# Patient Record
Sex: Male | Born: 1975 | Race: White | Hispanic: No | Marital: Single | Smoking: Current every day smoker
Health system: Southern US, Community
[De-identification: ages and names within clinical notes are randomized; demographics above are authoritative.]

## PROBLEM LIST (undated history)

## (undated) DIAGNOSIS — D751 Secondary polycythemia: Secondary | ICD-10-CM

## (undated) DIAGNOSIS — R569 Unspecified convulsions: Secondary | ICD-10-CM

---

## 2005-11-06 ENCOUNTER — Emergency Department: Payer: Self-pay | Admitting: Emergency Medicine

## 2014-09-03 ENCOUNTER — Emergency Department
Admit: 2014-09-03 | Disposition: A | Discharge status: Home / Self Care | Payer: Self-pay | Admitting: Emergency Medicine

## 2015-03-16 ENCOUNTER — Encounter: Payer: Self-pay | Admitting: *Deleted

## 2015-03-16 ENCOUNTER — Emergency Department
Admission: EM | Admit: 2015-03-16 | Discharge: 2015-03-16 | Disposition: A | Discharge status: Home / Self Care | Payer: Self-pay | Attending: Emergency Medicine | Admitting: Emergency Medicine

## 2015-03-16 DIAGNOSIS — Y998 Other external cause status: Secondary | ICD-10-CM | POA: Insufficient documentation

## 2015-03-16 DIAGNOSIS — G40909 Epilepsy, unspecified, not intractable, without status epilepticus: Secondary | ICD-10-CM | POA: Insufficient documentation

## 2015-03-16 DIAGNOSIS — S61411A Laceration without foreign body of right hand, initial encounter: Secondary | ICD-10-CM | POA: Insufficient documentation

## 2015-03-16 DIAGNOSIS — Y9289 Other specified places as the place of occurrence of the external cause: Secondary | ICD-10-CM | POA: Insufficient documentation

## 2015-03-16 DIAGNOSIS — W25XXXA Contact with sharp glass, initial encounter: Secondary | ICD-10-CM | POA: Insufficient documentation

## 2015-03-16 DIAGNOSIS — Y9389 Activity, other specified: Secondary | ICD-10-CM | POA: Insufficient documentation

## 2015-03-16 DIAGNOSIS — Z720 Tobacco use: Secondary | ICD-10-CM | POA: Insufficient documentation

## 2015-03-16 DIAGNOSIS — D751 Secondary polycythemia: Secondary | ICD-10-CM | POA: Insufficient documentation

## 2015-03-16 HISTORY — DX: Unspecified convulsions: R56.9

## 2015-03-16 HISTORY — DX: Secondary polycythemia: D75.1

## 2015-03-16 MED ORDER — OXYCODONE-ACETAMINOPHEN 7.5-325 MG PO TABS: 1.0000 | ORAL_TABLET | Freq: Four times a day (QID) | ORAL | Status: DC | PRN

## 2015-03-16 MED ORDER — OXYCODONE-ACETAMINOPHEN 5-325 MG PO TABS: 2.0000 | ORAL_TABLET | Freq: Once | ORAL | Status: DC

## 2015-03-16 MED ORDER — LIDOCAINE HCL (PF) 1 % IJ SOLN
INTRAMUSCULAR | Status: AC
  Filled 2015-03-16: qty 5

## 2015-03-16 MED ORDER — OXYCODONE-ACETAMINOPHEN 5-325 MG PO TABS
2.0000 | ORAL_TABLET | Freq: Once | ORAL | Status: AC
  Administered 2015-03-16: 2 via ORAL
  Filled 2015-03-16: qty 2

## 2015-03-16 MED ORDER — BACITRACIN-NEOMYCIN-POLYMYXIN 400-5-5000 EX OINT
TOPICAL_OINTMENT | CUTANEOUS | Status: AC
  Administered 2015-03-16: 1 via TOPICAL
  Filled 2015-03-16: qty 1

## 2015-03-16 MED ORDER — BACITRACIN-NEOMYCIN-POLYMYXIN OINTMENT TUBE
TOPICAL_OINTMENT | Freq: Once | CUTANEOUS | Status: AC
  Administered 2015-03-16: 1 via TOPICAL

## 2015-03-16 NOTE — ED Notes (Signed)
Pt cut his hand on a broken window. Laceration to the right palm, dressing placed in triage.

## 2015-03-16 NOTE — ED Provider Notes (Signed)
[facility] Emergency Department Provider Note  ____________________________________________  Time seen: Approximately 9:16 PM  I have reviewed the triage vital signs and the nursing notes.   HISTORY  Chief Complaint Extremity Laceration    HPI Robert Atkinson is a 39 y.o. male right hand laceration secondary to a glass cut. Patient cut his hand on a broken window. Patient state hemorrhage is controlled with direct pressure. Patient denies any loss sensation or loss of function. Patient is right-hand dominant. Patient rated his pain as a 10 over 10.   Past Medical History  Diagnosis Date  . Seizures (HCC)   . Polycythemia     There are no active problems to display for this patient.   History reviewed. No pertinent past surgical history.  Current Outpatient Rx  Name  Route  Sig  Dispense  Refill  . oxyCODONE-acetaminophen (PERCOCET) 7.5-325 MG tablet   Oral   Take 1 tablet by mouth every 6 (six) hours as needed for severe pain.   12 tablet   0   . oxyCODONE-acetaminophen (PERCOCET/ROXICET) 5-325 MG tablet   Oral   Take 2 tablets by mouth once.   30 tablet   0     Allergies Review of patient's allergies indicates not on file.  No family history on file.  Social History Social History  Substance Use Topics  . Smoking status: Current Every Day Smoker  . Smokeless tobacco: None  . Alcohol Use: Yes    Review of Systems Constitutional: No fever/chills Eyes: No visual changes. ENT: No sore throat. Cardiovascular: Denies chest pain. Respiratory: Denies shortness of breath. Gastrointestinal: No abdominal pain.  No nausea, no vomiting.  No diarrhea.  No constipation. Genitourinary: Negative for dysuria. Musculoskeletal: Negative for back pain. Skin: Negative for rash. Neurological: Negative for headaches, focal weakness or numbness. Psychiatric:Seizure disorder Hematological/Lymphatic:Polycythemia 10-point ROS otherwise  negative.  ____________________________________________   PHYSICAL EXAM:  VITAL SIGNS: ED Triage Vitals  Enc Vitals Group     BP 03/16/15 2029 138/92 mmHg     Pulse Rate 03/16/15 2029 84     Resp 03/16/15 2029 16     Temp 03/16/15 2029 98 F (36.7 C)     Temp Source 03/16/15 2029 Oral     SpO2 03/16/15 2029 97 %     Weight --      Height --      Head Cir --      Peak Flow --      Pain Score 03/16/15 2030 10     Pain Loc --      Pain Edu? --      Excl. in GC? --     Constitutional: Alert and oriented. Well appearing and in no acute distress. Eyes: Conjunctivae are normal. PERRL. EOMI. Head: Atraumatic. Nose: No congestion/rhinnorhea. Mouth/Throat: Mucous membranes are moist.  Oropharynx non-erythematous. Neck: No stridor.  No cervical spine tenderness to palpation. Hematological/Lymphatic/Immunilogical: No cervical lymphadenopathy. Cardiovascular: Normal rate, regular rhythm. Grossly normal heart sounds.  Good peripheral circulation. Respiratory: Normal respiratory effort.  No retractions. Lungs CTAB. Gastrointestinal: Soft and nontender. No distention. No abdominal bruits. No CVA tenderness. Musculoskeletal: No lower extremity tenderness nor edema.  No joint effusions. Neurologic:  Normal speech and language. No gross focal neurologic deficits are appreciated. No gait instability. Skin:  Skin is warm, dry and intact. No rash noted. 2.5 cm laceration palmar aspect the right hand. There is neurovascular intact free nuchal range of motion. Psychiatric: Mood and affect are normal. Speech  and behavior are normal.  ____________________________________________   LABS (all labs ordered are listed, but only abnormal results are displayed)  Labs Reviewed - No data to display ____________________________________________  EKG   ____________________________________________  RADIOLOGY   ____________________________________________   PROCEDURES  Procedure(s) performed:  See procedure note  Critical Care performed: No LACERATION REPAIR Performed by: [name] Authorized by: [name] Consent: Verbal consent obtained. Risks and benefits: risks, benefits and alternatives were discussed Consent given by: patient Patient identity confirmed: provided demographic data Prepped and Draped in normal sterile fashion Wound explored  Laceration Location: Right hand  Laceration Length: 2.5 cm  No Foreign Bodies seen or palpated  Anesthesia: local infiltration  Local anesthetic: lidocaine 1%   Anesthetic total: 4 mL   Irrigation method: syringe Amount of cleaning: standard  Skin closure: 3-0 nylon   Number of sutures: 6   Technique: Interrupted Patient tolerance: Patient tolerated the procedure well with no immediate complications.  ____________________________________________   INITIAL IMPRESSION / ASSESSMENT AND PLAN / ED COURSE  Pertinent labs & imaging results that were available during my care of the patient were reviewed by me and considered in my medical decision making (see chart for details). Right hand laceration. Patient given discharge instructions and advised follow-up in 10 days for suture removal. Patient given a three-day prescription for Percocets. Patient advised return by ER if condition worsens. ____________________________________________   FINAL CLINICAL IMPRESSION(S) / ED DIAGNOSES  Final diagnoses:  Hand laceration, right, initial encounter      Joni ReiningRonald K [name], PA-C 03/16/15 2124  [name], MD 03/16/15 2314

## 2015-03-16 NOTE — Discharge Instructions (Signed)
Laceration Care, Adult  A laceration is a cut that goes through all layers of the skin. The cut also goes into the tissue that is right under the skin. Some cuts heal on their own. Others need to be closed with stitches (sutures), staples, skin adhesive strips, or wound glue. Taking care of your cut lowers your risk of infection and helps your cut to heal better.  HOW TO TAKE CARE OF YOUR CUT  For stitches or staples:  · Keep the wound clean and dry.  · If you were given a bandage (dressing), you should change it at least one time per day or as told by your doctor. You should also change it if it gets wet or dirty.  · Keep the wound completely dry for the first 24 hours or as told by your doctor. After that time, you may take a shower or a bath. However, make sure that the wound is not soaked in water until after the stitches or staples have been removed.  · Clean the wound one time each day or as told by your doctor:    Wash the wound with soap and water.    Rinse the wound with water until all of the soap comes off.    Pat the wound dry with a clean towel. Do not rub the wound.  · After you clean the wound, put a thin layer of antibiotic ointment on it as told by your doctor. This ointment:    Helps to prevent infection.    Keeps the bandage from sticking to the wound.  · Have your stitches or staples removed as told by your doctor.  If your doctor used skin adhesive strips:   · Keep the wound clean and dry.  · If you were given a bandage, you should change it at least one time per day or as told by your doctor. You should also change it if it gets dirty or wet.  · Do not get the skin adhesive strips wet. You can take a shower or a bath, but be careful to keep the wound dry.  · If the wound gets wet, pat it dry with a clean towel. Do not rub the wound.  · Skin adhesive strips fall off on their own. You can trim the strips as the wound heals. Do not remove any strips that are still stuck to the wound. They will  fall off after a while.  If your doctor used wound glue:  · Try to keep your wound dry, but you may briefly wet it in the shower or bath. Do not soak the wound in water, such as by swimming.  · After you take a shower or a bath, gently pat the wound dry with a clean towel. Do not rub the wound.  · Do not do any activities that will make you really sweaty until the skin glue has fallen off on its own.  · Do not apply liquid, cream, or ointment medicine to your wound while the skin glue is still on.  · If you were given a bandage, you should change it at least one time per day or as told by your doctor. You should also change it if it gets dirty or wet.  · If a bandage is placed over the wound, do not let the tape for the bandage touch the skin glue.  · Do not pick at the glue. The skin glue usually stays on for 5-10 days. Then, it   falls off of the skin.  General Instructions   · To help prevent scarring, make sure to cover your wound with sunscreen whenever you are outside after stitches are removed, after adhesive strips are removed, or when wound glue stays in place and the wound is healed. Make sure to wear a sunscreen of at least 30 SPF.  · Take over-the-counter and prescription medicines only as told by your doctor.  · If you were given antibiotic medicine or ointment, take or apply it as told by your doctor. Do not stop using the antibiotic even if your wound is getting better.  · Do not scratch or pick at the wound.  · Keep all follow-up visits as told by your doctor. This is important.  · Check your wound every day for signs of infection. Watch for:    Redness, swelling, or pain.    Fluid, blood, or pus.  · Raise (elevate) the injured area above the level of your heart while you are sitting or lying down, if possible.  GET HELP IF:  · You got a tetanus shot and you have any of these problems at the injection site:    Swelling.    Very bad pain.    Redness.    Bleeding.  · You have a fever.  · A wound that was  closed breaks open.  · You notice a bad smell coming from your wound or your bandage.  · You notice something coming out of the wound, such as wood or glass.  · Medicine does not help your pain.  · You have more redness, swelling, or pain at the site of your wound.  · You have fluid, blood, or pus coming from your wound.  · You notice a change in the color of your skin near your wound.  · You need to change the bandage often because fluid, blood, or pus is coming from the wound.  · You start to have a new rash.  · You start to have numbness around the wound.  GET HELP RIGHT AWAY IF:  · You have very bad swelling around the wound.  · Your pain suddenly gets worse and is very bad.  · You notice painful lumps near the wound or on skin that is anywhere on your body.  · You have a red streak going away from your wound.  · The wound is on your hand or foot and you cannot move a finger or toe like you usually can.  · The wound is on your hand or foot and you notice that your fingers or toes look pale or bluish.     This information is not intended to replace advice given to you by your health care provider. Make sure you discuss any questions you have with your health care provider.     Document Released: 11/07/2007 Document Revised: 10/05/2014 Document Reviewed: 05/17/2014  Elsevier Interactive Patient Education ©2016 [employer]

## 2016-05-12 IMAGING — CR DG FOOT COMPLETE 3+V*L*
1 series · 3 of 3 positions shown · non-contrast
Comparison: None.

CLINICAL DATA: Left foot pain/injury

EXAM:
LEFT FOOT - COMPLETE 3+ VIEW

[Series 1: dxr foot lt comp w/obliques · 0.14mm/px · 3 of 3 slices shown]
[im 1/3]
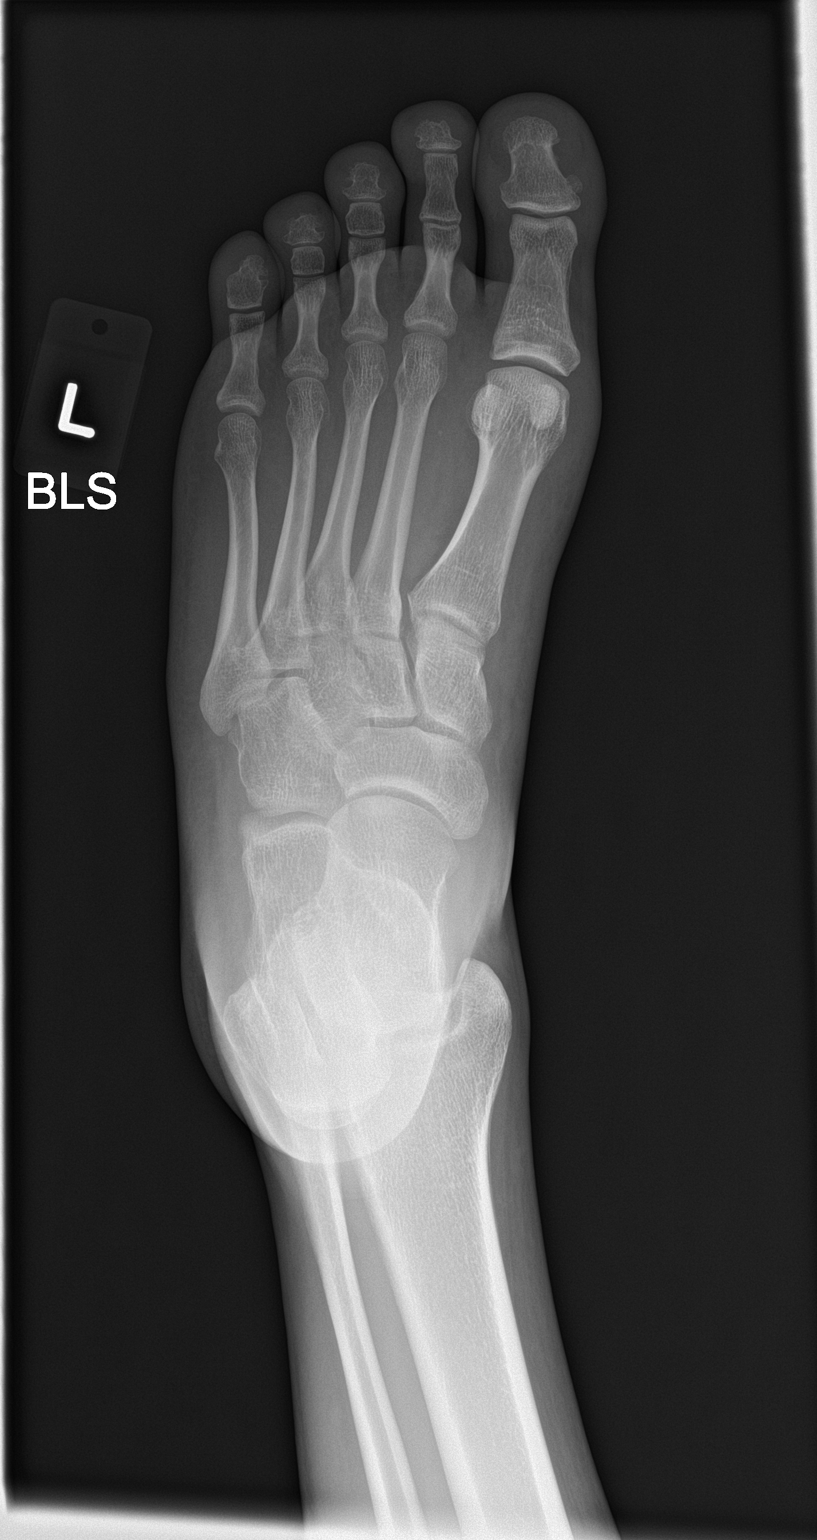
[im 2/3]
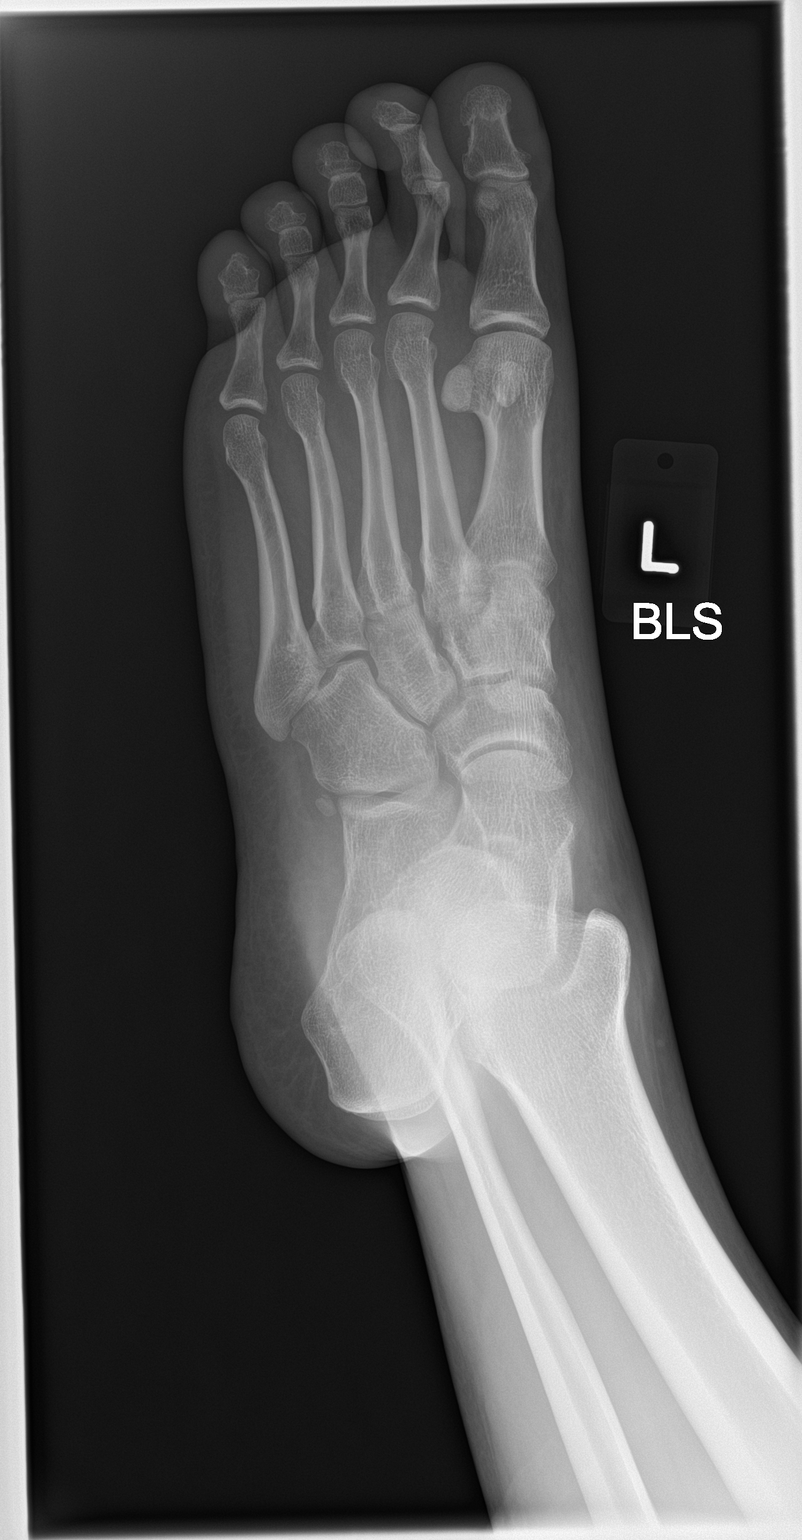
[im 3/3]
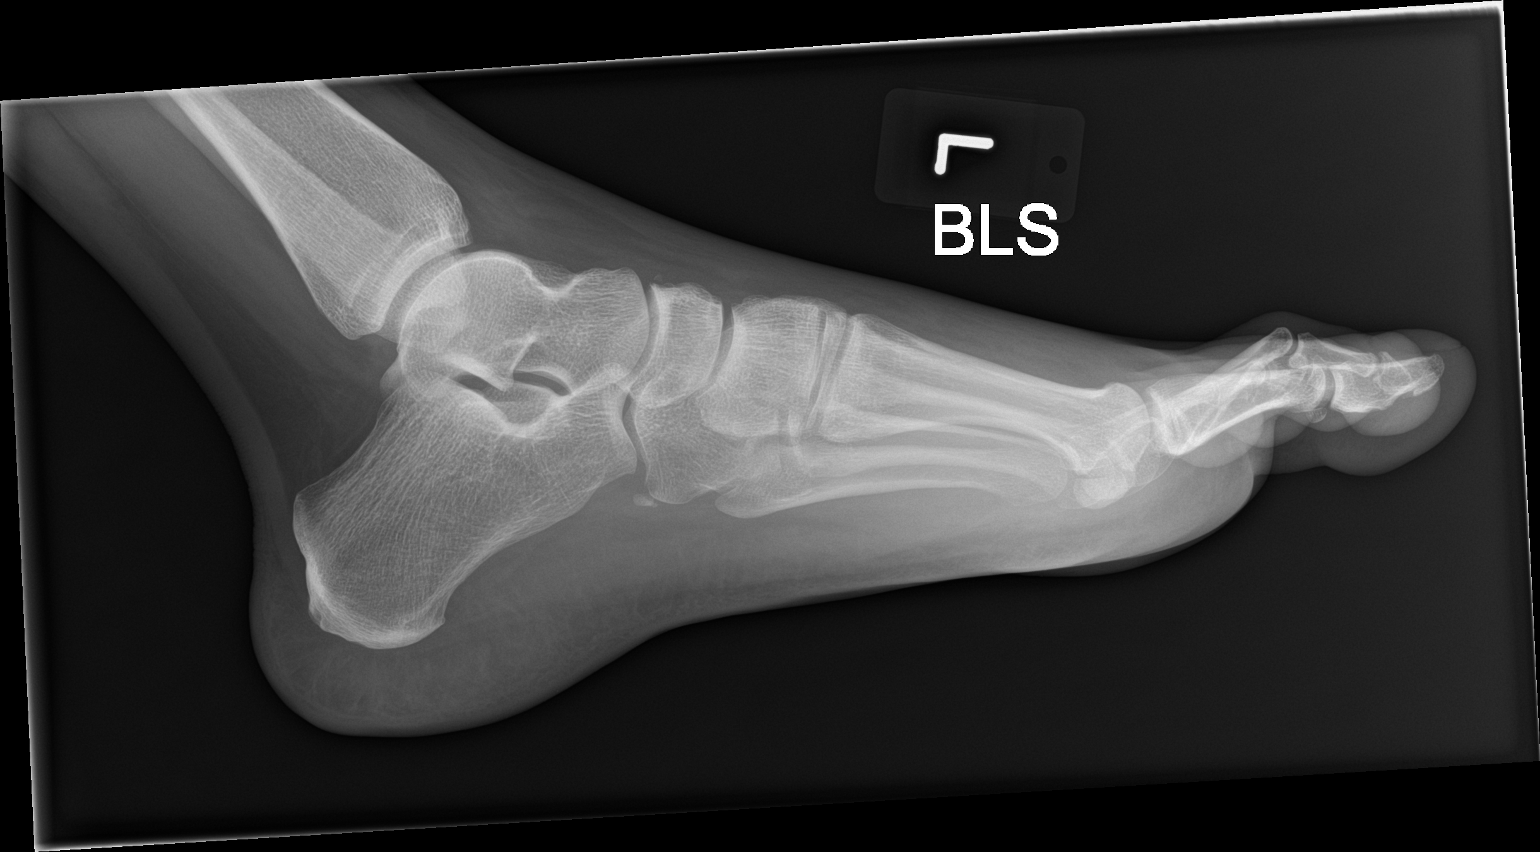

[3 of 3 positions shown; findings below may reference images not displayed]

FINDINGS: No fracture or dislocation is seen.

The joint spaces are preserved.

Visualized soft tissues are within normal limits.
IMPRESSION: No fracture or dislocation is seen.

## 2018-08-14 ENCOUNTER — Emergency Department: Payer: Self-pay

## 2018-08-14 ENCOUNTER — Other Ambulatory Visit: Payer: Self-pay

## 2018-08-14 ENCOUNTER — Emergency Department
Admission: EM | Admit: 2018-08-14 | Discharge: 2018-08-14 | Disposition: A | Discharge status: Home / Self Care | Payer: Self-pay | Attending: Emergency Medicine | Admitting: Emergency Medicine

## 2018-08-14 DIAGNOSIS — S9031XA Contusion of right foot, initial encounter: Secondary | ICD-10-CM | POA: Insufficient documentation

## 2018-08-14 DIAGNOSIS — Y9289 Other specified places as the place of occurrence of the external cause: Secondary | ICD-10-CM | POA: Insufficient documentation

## 2018-08-14 DIAGNOSIS — Y9389 Activity, other specified: Secondary | ICD-10-CM | POA: Insufficient documentation

## 2018-08-14 DIAGNOSIS — Y998 Other external cause status: Secondary | ICD-10-CM | POA: Insufficient documentation

## 2018-08-14 DIAGNOSIS — W228XXA Striking against or struck by other objects, initial encounter: Secondary | ICD-10-CM | POA: Insufficient documentation

## 2018-08-14 MED ORDER — MELOXICAM 7.5 MG PO TABS
15.0000 mg | ORAL_TABLET | Freq: Every day | ORAL | Status: DC
  Administered 2018-08-14: 15 mg via ORAL
  Filled 2018-08-14: qty 2

## 2018-08-14 MED ORDER — MELOXICAM 15 MG PO TABS: 15.0000 mg | ORAL_TABLET | Freq: Every day | ORAL | 1 refills | Status: AC
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
## 2018-08-23 ENCOUNTER — Emergency Department
Admission: EM | Admit: 2018-08-23 | Discharge: 2018-08-23 | Disposition: A | Discharge status: Home / Self Care | Payer: Self-pay | Attending: Emergency Medicine | Admitting: Emergency Medicine

## 2018-08-23 ENCOUNTER — Emergency Department: Payer: Self-pay

## 2018-08-23 ENCOUNTER — Other Ambulatory Visit: Payer: Self-pay

## 2018-08-23 DIAGNOSIS — M79671 Pain in right foot: Secondary | ICD-10-CM | POA: Insufficient documentation

## 2018-08-23 DIAGNOSIS — F17210 Nicotine dependence, cigarettes, uncomplicated: Secondary | ICD-10-CM | POA: Insufficient documentation

## 2018-08-23 MED ORDER — HYDROCODONE-ACETAMINOPHEN 5-325 MG PO TABS
1.0000 | ORAL_TABLET | Freq: Once | ORAL | Status: AC
  Administered 2018-08-23: 1 via ORAL
  Filled 2018-08-23: qty 1

## 2018-08-23 NOTE — ED Provider Notes (Signed)
[facility] Emergency Department Provider Note ____________________________________________  Time seen: Approximately 10:25 PM  I have reviewed the triage vital signs and the nursing notes.   HISTORY  Chief Complaint Foot Pain    HPI Robert Atkinson is a 43 y.o. male who presents to the emergency department for evaluation and treatment of right foot pain.  Patient states that he had a seizure today and somehow hurt his foot.  He was recently evaluated for foot pain after dropping a television on it.  He states that since his seizure today the pain has been worse.  He states that he is not currently on any type of seizure medication because he is "allergic" to all of them.  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 Polycythemia   . Seizures (HCC)     There are no active problems to display for this patient.   History reviewed. No pertinent surgical history.  Prior to Admission medications   Not on File    Allergies Penicillins  No family history on file.  Social History Social History   Tobacco Use  . Smoking status: Current Every Day Smoker    Packs/day: 1.00    Types: Cigarettes  . Smokeless tobacco: Never Used  Substance Use Topics  . Alcohol use: Yes    Alcohol/week: 6.0 standard drinks    Types: 6 Cans of beer per week    Comment: daily  . Drug use: Never    Review of Systems Constitutional: Negative for fever. Cardiovascular: Negative for chest pain. Respiratory: Negative for shortness of breath. Musculoskeletal: Positive for right foot pain Skin: Positive for scabbed abrasion on right foot.  Neurological: Negative for decrease in sensation  ____________________________________________   PHYSICAL EXAM:  VITAL SIGNS: ED Triage Vitals  Enc Vitals Group     BP 08/23/18 2134 (!) 133/93     Pulse Rate 08/23/18 2134 93     Resp 08/23/18  2134 17     Temp 08/23/18 2134 97.9 F (36.6 C)     Temp src --      SpO2 08/23/18 2134 100 %     Weight 08/23/18 2135 149 lb 14.6 oz (68 kg)     Height --      Head Circumference --      Peak Flow --      Pain Score 08/23/18 2134 8     Pain Loc --      Pain Edu? --      Excl. in GC? --     Constitutional: Alert and oriented. Well appearing and in no acute distress. Eyes: Conjunctivae are clear without discharge or drainage Head: Atraumatic Neck: Supple Respiratory: No cough. Respirations are even and unlabored. Musculoskeletal: No deformity of the right ankle or foot on exam.  Full range of motion demonstrated.  No edema noted. Neurologic: Awake, alert, oriented. Skin: Healing abrasion to the dorsal aspect of the right foot without surrounding erythema or lymphangitis.  Psychiatric: Affect and behavior are appropriate.  ____________________________________________   LABS (all labs ordered are listed, but only abnormal results are displayed)  Labs Reviewed - No data to display ____________________________________________  RADIOLOGY  Image of the right foot is negative for acute bony abnormality per radiology. ____________________________________________   PROCEDURES  Procedures  ____________________________________________   INITIAL IMPRESSION / ASSESSMENT AND PLAN / ED COURSE  Robert Atkinson is a 43 y.o.  who presents to the emergency department for treatment and evaluation of right foot pain after having a seizure earlier today.  X-ray of the right foot does not demonstrate any acute bony abnormalities.  The patient will be wrapped in an Ace bandage and advised to rest, ice, and elevate the foot.  He is to continue taking meloxicam as prescribed at his last visit.  I do not see where the patient has had treatment for leukemia and I do not see allergies listed for any antiseizure medications.  The patient was strongly encouraged to obtain a list of his allergies  from his mother and bring the list with him to his next visit.  He has also been advised that he needs to see a neurologist.  He does not wish to have any type of evaluation for the seizure-like activity.  He was advised to follow up with podiatry if the foot pain does not improve with RICE and meloxicam. He is to see schedule an appointment with a primary care provider and neurologist as soon as possible.  Medications  HYDROcodone-acetaminophen (NORCO/VICODIN) 5-325 MG per tablet 1 tablet (has no administration in time range)    Pertinent labs & imaging results that were available during my care of the patient were reviewed by me and considered in my medical decision making (see chart for details).  _________________________________________   FINAL CLINICAL IMPRESSION(S) / ED DIAGNOSES  Final diagnoses:  Foot pain, right    ED Discharge Orders    None       If controlled substance prescribed during this visit, 12 month history viewed on the [place] prior to issuing an initial prescription for Schedule II or III opiod.   Chinita Pester, FNP 08/23/18 2307    Sharyn Creamer, MD 08/24/18 [phone]

## 2018-08-23 NOTE — ED Notes (Signed)
Pt verbalized understanding of d/c instructions, RX, and f/u care. No further questions at this time. Pt ambulatory to the exit.

## 2018-08-23 NOTE — ED Triage Notes (Signed)
Patient c/o right foot pain. Patient reports being seen in this ED for same on 3/12. Patient reports he fell after seizure today and injured same foot. Patient does not want to be evaluated for seizures today - only wants to be evaluated for foot pain.

## 2018-08-23 NOTE — Discharge Instructions (Signed)
Continue taking your meloxicam.  I recommend that you carry a list of your medication allergies to your next visit.  The only allergy listed is penicillin.  I also recommend that you see a neurologist for the seizures.  Return to the emergency department for symptoms of concern if you are unable to schedule an appointment with primary care or the specialist.

## 2020-05-01 IMAGING — DX RIGHT FOOT COMPLETE - 3+ VIEW
3 series · 3 of 3 positions shown · non-contrast
Comparison: 08/14/2018

CLINICAL DATA: Seizure this afternoon, fell twisting his RIGHT
ankle, bruising and swelling, lateral and dorsal pain

EXAM:
RIGHT FOOT COMPLETE - 3+ VIEW

[foot ap]
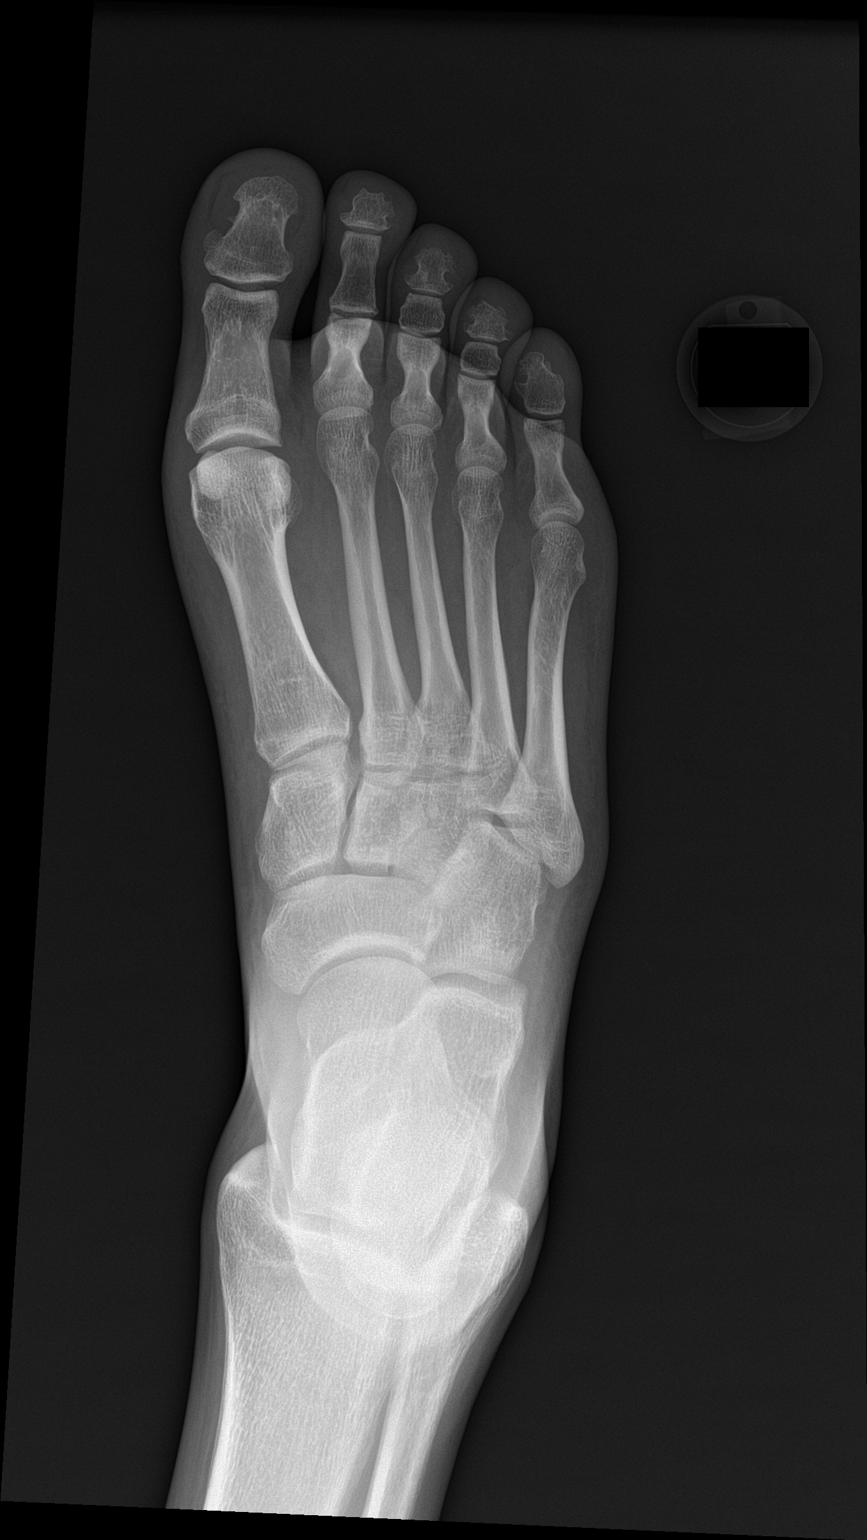

[foot obl]
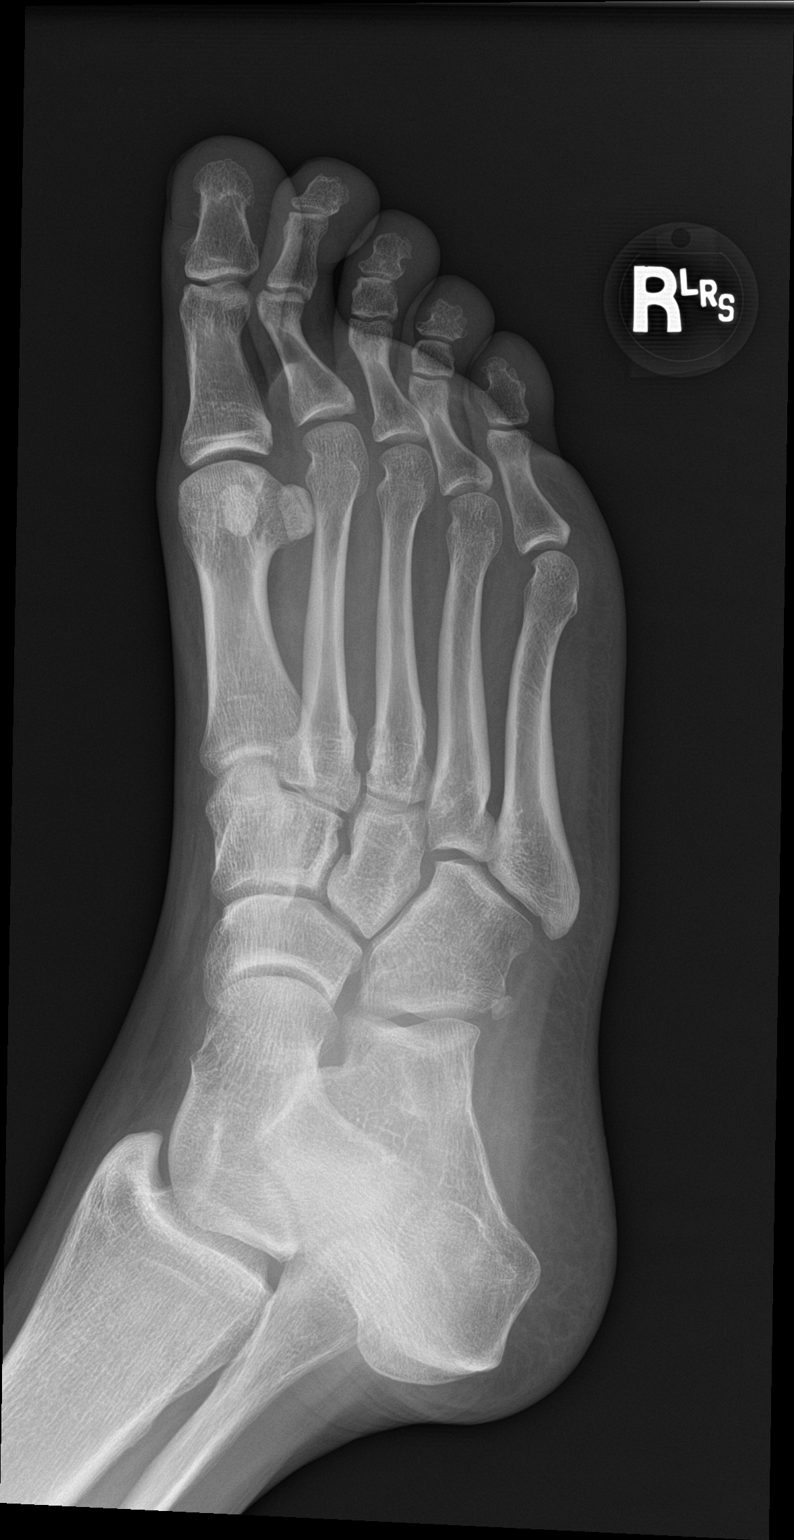

[foot lat]
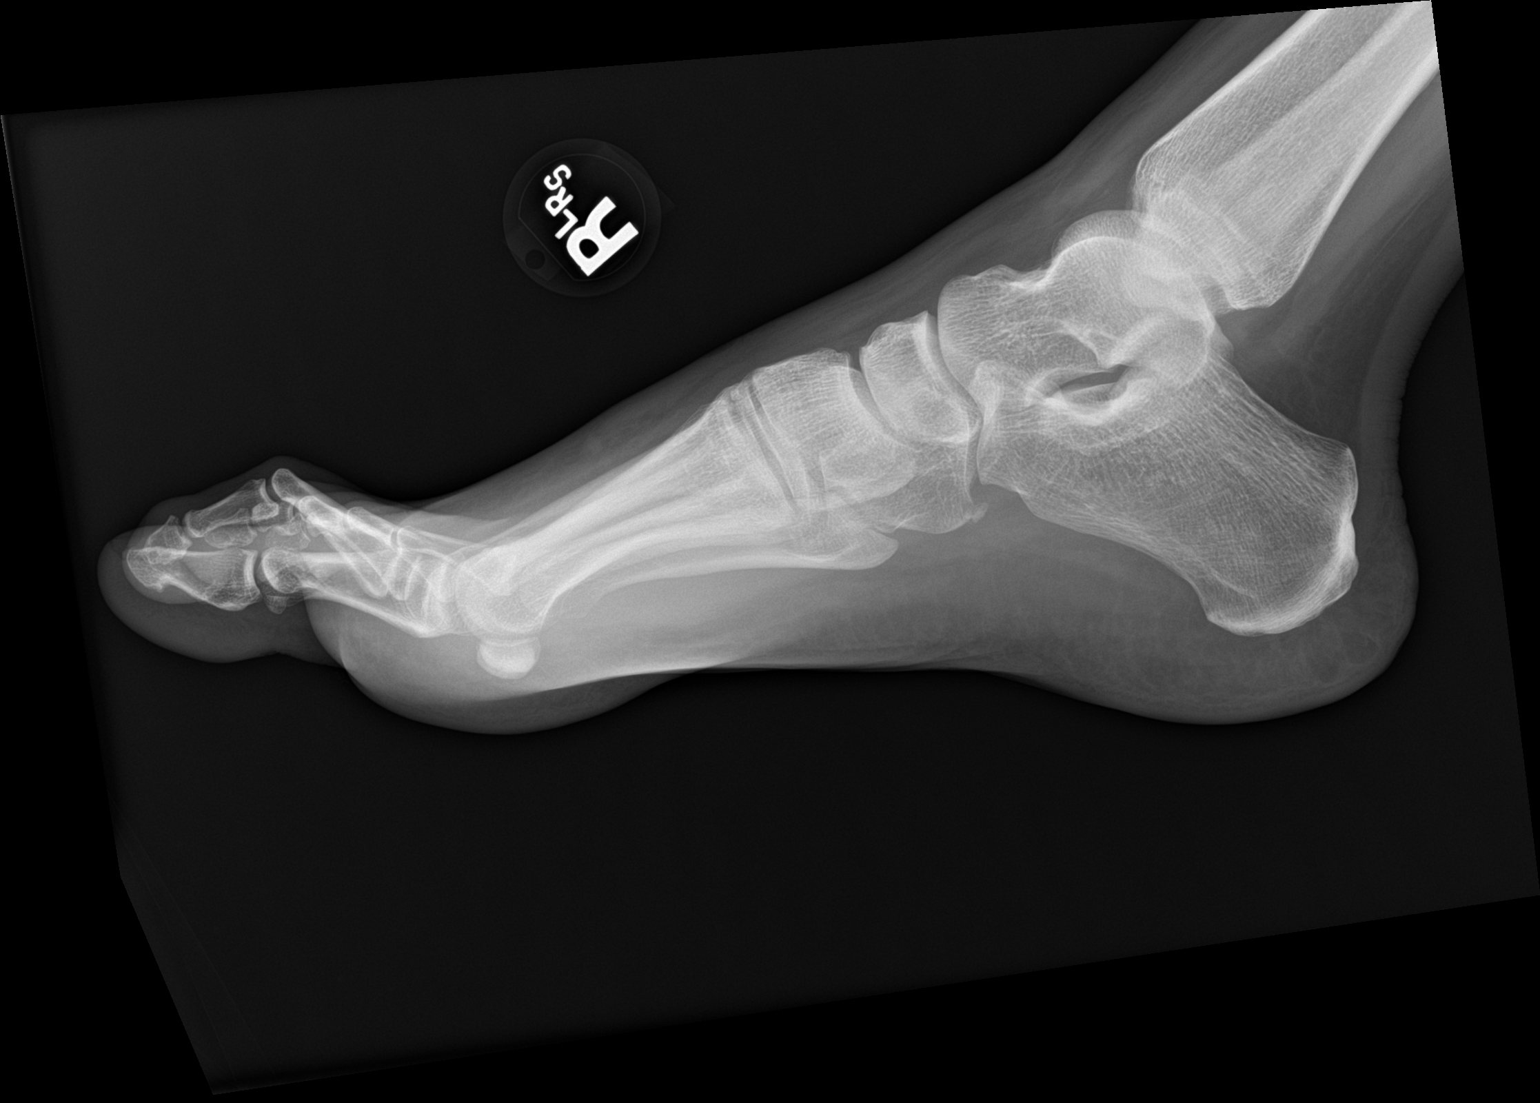

[3 of 3 positions shown; findings below may reference images not displayed]

FINDINGS: Osseous mineralization normal.

Joint spaces preserved.

No acute fracture, dislocation, or bone destruction.
IMPRESSION: No acute osseous abnormalities.

## 2021-05-16 ENCOUNTER — Emergency Department: Payer: Self-pay

## 2021-05-16 ENCOUNTER — Inpatient Hospital Stay
Admission: EM | Admit: 2021-05-16 | Discharge: 2021-06-04 | DRG: 441 | Disposition: E | Payer: Self-pay | Attending: Internal Medicine | Admitting: Internal Medicine

## 2021-05-16 ENCOUNTER — Other Ambulatory Visit: Payer: Self-pay

## 2021-05-16 DIAGNOSIS — Z515 Encounter for palliative care: Secondary | ICD-10-CM

## 2021-05-16 DIAGNOSIS — Z66 Do not resuscitate: Secondary | ICD-10-CM | POA: Diagnosis present

## 2021-05-16 DIAGNOSIS — Z880 Allergy status to penicillin: Secondary | ICD-10-CM

## 2021-05-16 DIAGNOSIS — R188 Other ascites: Secondary | ICD-10-CM | POA: Diagnosis present

## 2021-05-16 DIAGNOSIS — J9601 Acute respiratory failure with hypoxia: Secondary | ICD-10-CM

## 2021-05-16 DIAGNOSIS — K529 Noninfective gastroenteritis and colitis, unspecified: Secondary | ICD-10-CM | POA: Diagnosis present

## 2021-05-16 DIAGNOSIS — F17210 Nicotine dependence, cigarettes, uncomplicated: Secondary | ICD-10-CM | POA: Diagnosis present

## 2021-05-16 DIAGNOSIS — N179 Acute kidney failure, unspecified: Secondary | ICD-10-CM | POA: Diagnosis present

## 2021-05-16 DIAGNOSIS — F1010 Alcohol abuse, uncomplicated: Secondary | ICD-10-CM | POA: Diagnosis present

## 2021-05-16 DIAGNOSIS — N170 Acute kidney failure with tubular necrosis: Secondary | ICD-10-CM

## 2021-05-16 DIAGNOSIS — F10129 Alcohol abuse with intoxication, unspecified: Secondary | ICD-10-CM

## 2021-05-16 DIAGNOSIS — Z681 Body mass index (BMI) 19 or less, adult: Secondary | ICD-10-CM

## 2021-05-16 DIAGNOSIS — J441 Chronic obstructive pulmonary disease with (acute) exacerbation: Secondary | ICD-10-CM | POA: Diagnosis present

## 2021-05-16 DIAGNOSIS — K7201 Acute and subacute hepatic failure with coma: Secondary | ICD-10-CM

## 2021-05-16 DIAGNOSIS — K7290 Hepatic failure, unspecified without coma: Secondary | ICD-10-CM | POA: Diagnosis present

## 2021-05-16 DIAGNOSIS — N19 Unspecified kidney failure: Secondary | ICD-10-CM

## 2021-05-16 DIAGNOSIS — A419 Sepsis, unspecified organism: Secondary | ICD-10-CM

## 2021-05-16 DIAGNOSIS — I781 Nevus, non-neoplastic: Secondary | ICD-10-CM | POA: Diagnosis present

## 2021-05-16 DIAGNOSIS — R64 Cachexia: Secondary | ICD-10-CM | POA: Diagnosis present

## 2021-05-16 DIAGNOSIS — K7200 Acute and subacute hepatic failure without coma: Principal | ICD-10-CM | POA: Diagnosis present

## 2021-05-16 DIAGNOSIS — Z20822 Contact with and (suspected) exposure to covid-19: Secondary | ICD-10-CM | POA: Diagnosis present

## 2021-05-16 DIAGNOSIS — J9602 Acute respiratory failure with hypercapnia: Secondary | ICD-10-CM | POA: Diagnosis present

## 2021-05-16 DIAGNOSIS — D649 Anemia, unspecified: Secondary | ICD-10-CM | POA: Diagnosis present

## 2021-05-16 DIAGNOSIS — G928 Other toxic encephalopathy: Secondary | ICD-10-CM | POA: Diagnosis present

## 2021-05-16 LAB — MAGNESIUM: Magnesium: 2 mg/dL (ref 1.7–2.4)

## 2021-05-16 LAB — BASIC METABOLIC PANEL
Anion gap: 14 (ref 5–15)
BUN: 69 mg/dL — ABNORMAL HIGH (ref 6–20)
CO2: 17 mmol/L — ABNORMAL LOW (ref 22–32)
Calcium: 7.7 mg/dL — ABNORMAL LOW (ref 8.9–10.3)
Chloride: 90 mmol/L — ABNORMAL LOW (ref 98–111)
Creatinine, Ser: 4.97 mg/dL — ABNORMAL HIGH (ref 0.61–1.24)
GFR, Estimated: 14 mL/min — ABNORMAL LOW (ref >60)
Glucose, Bld: 76 mg/dL (ref 70–99)
Potassium: 4.5 mmol/L (ref 3.5–5.1)
Sodium: 121 mmol/L — ABNORMAL LOW (ref 135–145)

## 2021-05-16 LAB — CBC WITH DIFFERENTIAL/PLATELET
Abs Immature Granulocytes: 0.07 10*3/uL (ref 0.00–0.07)
Basophils Absolute: 0 10*3/uL (ref 0.0–0.1)
Basophils Relative: 0 %
Eosinophils Absolute: 0.2 10*3/uL (ref 0.0–0.5)
Eosinophils Relative: 1 %
HCT: 31.6 % — ABNORMAL LOW (ref 39.0–52.0)
Hemoglobin: 11.6 g/dL — ABNORMAL LOW (ref 13.0–17.0)
Immature Granulocytes: 1 %
Lymphocytes Relative: 31 %
Lymphs Abs: 3.6 10*3/uL (ref 0.7–4.0)
MCH: 37.2 pg — ABNORMAL HIGH (ref 26.0–34.0)
MCHC: 36.7 g/dL — ABNORMAL HIGH (ref 30.0–36.0)
MCV: 101.3 fL — ABNORMAL HIGH (ref 80.0–100.0)
Monocytes Absolute: 0.6 10*3/uL (ref 0.1–1.0)
Monocytes Relative: 5 %
Neutro Abs: 7.5 10*3/uL (ref 1.7–7.7)
Neutrophils Relative %: 62 %
Platelets: 135 10*3/uL — ABNORMAL LOW (ref 150–400)
RBC: 3.12 MIL/uL — ABNORMAL LOW (ref 4.22–5.81)
RDW: 16 % — ABNORMAL HIGH (ref 11.5–15.5)
Smear Review: NORMAL
WBC: 11.9 10*3/uL — ABNORMAL HIGH (ref 4.0–10.5)
nRBC: 0.2 % (ref 0.0–0.2)

## 2021-05-16 LAB — BLOOD GAS, VENOUS
Acid-base deficit: 8.1 mmol/L — ABNORMAL HIGH (ref 0.0–2.0)
Bicarbonate: 14.6 mmol/L — ABNORMAL LOW (ref 20.0–28.0)
O2 Saturation: 85.5 %
Patient temperature: 37
pCO2, Ven: 22 mmHg — ABNORMAL LOW (ref 44.0–60.0)
pH, Ven: 7.43 (ref 7.250–7.430)
pO2, Ven: 49 mmHg — ABNORMAL HIGH (ref 32.0–45.0)

## 2021-05-16 LAB — PROTIME-INR
INR: 1.8 — ABNORMAL HIGH (ref 0.8–1.2)
Prothrombin Time: 21.1 seconds — ABNORMAL HIGH (ref 11.4–15.2)

## 2021-05-16 LAB — TROPONIN I (HIGH SENSITIVITY)
Troponin I (High Sensitivity): 6 ng/L (ref <18)
Troponin I (High Sensitivity): 4 ng/L (ref <18)

## 2021-05-16 LAB — LIPASE, BLOOD: Lipase: 83 U/L — ABNORMAL HIGH (ref 11–51)

## 2021-05-16 LAB — LACTIC ACID, PLASMA
Lactic Acid, Venous: 2.6 mmol/L (ref 0.5–1.9)
Lactic Acid, Venous: 3.4 mmol/L (ref 0.5–1.9)
Lactic Acid, Venous: 6.1 mmol/L (ref 0.5–1.9)

## 2021-05-16 LAB — HEPATIC FUNCTION PANEL
ALT: 41 U/L (ref 0–44)
AST: 118 U/L — ABNORMAL HIGH (ref 15–41)
Albumin: 2.3 g/dL — ABNORMAL LOW (ref 3.5–5.0)
Alkaline Phosphatase: 66 U/L (ref 38–126)
Bilirubin, Direct: 3 mg/dL — ABNORMAL HIGH (ref 0.0–0.2)
Indirect Bilirubin: 3.6 mg/dL — ABNORMAL HIGH (ref 0.3–0.9)
Total Bilirubin: 6.6 mg/dL — ABNORMAL HIGH (ref 0.3–1.2)
Total Protein: 6.9 g/dL (ref 6.5–8.1)

## 2021-05-16 LAB — ETHANOL: Alcohol, Ethyl (B): <10 mg/dL (ref <10)

## 2021-05-16 LAB — RESP PANEL BY RT-PCR (FLU A&B, COVID) ARPGX2
Influenza A by PCR: NEGATIVE
Influenza B by PCR: NEGATIVE
SARS Coronavirus 2 by RT PCR: NEGATIVE

## 2021-05-16 LAB — CK: Total CK: 45 U/L — ABNORMAL LOW (ref 49–397)

## 2021-05-16 LAB — PHOSPHORUS: Phosphorus: 4.1 mg/dL (ref 2.5–4.6)

## 2021-05-16 LAB — HIV ANTIBODY (ROUTINE TESTING W REFLEX): HIV Screen 4th Generation wRfx: NONREACTIVE

## 2021-05-16 LAB — AMMONIA: Ammonia: 85 umol/L — ABNORMAL HIGH (ref 9–35)

## 2021-05-16 MED ORDER — ONDANSETRON HCL 4 MG/2ML IJ SOLN
4.0000 mg | Freq: Four times a day (QID) | INTRAMUSCULAR | Status: DC | PRN
  Administered 2021-05-16: 4 mg via INTRAVENOUS
  Filled 2021-05-16: qty 2

## 2021-05-16 MED ORDER — LORAZEPAM 2 MG/ML IJ SOLN: 1.0000 mg | INTRAMUSCULAR | Status: DC | PRN

## 2021-05-16 MED ORDER — VANCOMYCIN HCL IN DEXTROSE 1-5 GM/200ML-% IV SOLN
1000.0000 mg | Freq: Once | INTRAVENOUS | Status: AC
  Administered 2021-05-16: 1000 mg via INTRAVENOUS
  Filled 2021-05-16: qty 200

## 2021-05-16 MED ORDER — HALOPERIDOL LACTATE 5 MG/ML IJ SOLN: 0.5000 mg | INTRAMUSCULAR | Status: DC | PRN

## 2021-05-16 MED ORDER — MORPHINE SULFATE (PF) 2 MG/ML IV SOLN
2.0000 mg | INTRAVENOUS | Status: DC | PRN
  Administered 2021-05-16: 2 mg via INTRAVENOUS
  Filled 2021-05-16: qty 1

## 2021-05-16 MED ORDER — ORAL CARE MOUTH RINSE
15.0000 mL | Freq: Two times a day (BID) | OROMUCOSAL | Status: DC
  Administered 2021-05-17 – 2021-05-18 (×3): 15 mL via OROMUCOSAL

## 2021-05-16 MED ORDER — MORPHINE 100MG IN NS 100ML (1MG/ML) PREMIX INFUSION
2.0000 mg/h | INTRAVENOUS | Status: DC
  Administered 2021-05-16: 3 mg/h via INTRAVENOUS
  Administered 2021-05-17 – 2021-05-18 (×3): 5 mg/h via INTRAVENOUS
  Filled 2021-05-16 (×4): qty 100

## 2021-05-16 MED ORDER — SODIUM CHLORIDE 0.9 % IV SOLN: 2.0000 g | INTRAVENOUS | Status: DC

## 2021-05-16 MED ORDER — METRONIDAZOLE 500 MG/100ML IV SOLN
500.0000 mg | Freq: Once | INTRAVENOUS | Status: AC
  Administered 2021-05-16: 500 mg via INTRAVENOUS
  Filled 2021-05-16: qty 100

## 2021-05-16 MED ORDER — DOCUSATE SODIUM 100 MG PO CAPS: 100.0000 mg | ORAL_CAPSULE | Freq: Two times a day (BID) | ORAL | Status: DC | PRN

## 2021-05-16 MED ORDER — GLYCOPYRROLATE 1 MG PO TABS
1.0000 mg | ORAL_TABLET | ORAL | Status: DC | PRN
  Filled 2021-05-16: qty 1

## 2021-05-16 MED ORDER — IPRATROPIUM-ALBUTEROL 0.5-2.5 (3) MG/3ML IN SOLN: 3.0000 mL | RESPIRATORY_TRACT | Status: DC | PRN

## 2021-05-16 MED ORDER — LACTATED RINGERS IV BOLUS
1000.0000 mL | Freq: Once | INTRAVENOUS | Status: AC
  Administered 2021-05-16: 1000 mL via INTRAVENOUS

## 2021-05-16 MED ORDER — IPRATROPIUM-ALBUTEROL 0.5-2.5 (3) MG/3ML IN SOLN
3.0000 mL | Freq: Once | RESPIRATORY_TRACT | Status: AC
  Administered 2021-05-16: 3 mL via RESPIRATORY_TRACT
  Filled 2021-05-16: qty 3

## 2021-05-16 MED ORDER — GLYCOPYRROLATE 0.2 MG/ML IJ SOLN
0.2000 mg | INTRAMUSCULAR | Status: DC | PRN
  Administered 2021-05-17: 04:00:00 0.2 mg via INTRAVENOUS
  Filled 2021-05-16: qty 1

## 2021-05-16 MED ORDER — GLYCOPYRROLATE 0.2 MG/ML IJ SOLN: 0.2000 mg | INTRAMUSCULAR | Status: DC | PRN

## 2021-05-16 MED ORDER — BIOTENE DRY MOUTH MT LIQD
15.0000 mL | OROMUCOSAL | Status: DC | PRN
  Filled 2021-05-16: qty 15

## 2021-05-16 MED ORDER — NOREPINEPHRINE 4 MG/250ML-% IV SOLN
0.0000 ug/min | INTRAVENOUS | Status: DC
  Administered 2021-05-16: 2 ug/min via INTRAVENOUS
  Filled 2021-05-16: qty 250

## 2021-05-16 MED ORDER — SODIUM CHLORIDE 0.9% FLUSH: 3.0000 mL | INTRAVENOUS | Status: DC | PRN

## 2021-05-16 MED ORDER — LORAZEPAM 1 MG PO TABS: 1.0000 mg | ORAL_TABLET | ORAL | Status: DC | PRN

## 2021-05-16 MED ORDER — SODIUM CHLORIDE 0.9 % IV SOLN
2.0000 g | Freq: Once | INTRAVENOUS | Status: AC
  Administered 2021-05-16: 2 g via INTRAVENOUS
  Filled 2021-05-16: qty 2

## 2021-05-16 MED ORDER — SODIUM CHLORIDE 0.9% FLUSH
3.0000 mL | Freq: Two times a day (BID) | INTRAVENOUS | Status: DC
  Administered 2021-05-16 – 2021-05-18 (×3): 3 mL via INTRAVENOUS

## 2021-05-16 MED ORDER — SODIUM CHLORIDE 0.9 % IV SOLN: 250.0000 mL | INTRAVENOUS | Status: DC | PRN

## 2021-05-16 MED ORDER — SODIUM CHLORIDE 0.9 % IV BOLUS (SEPSIS)
1000.0000 mL | Freq: Once | INTRAVENOUS | Status: AC
  Administered 2021-05-16: 1000 mL via INTRAVENOUS

## 2021-05-16 MED ORDER — IPRATROPIUM-ALBUTEROL 0.5-2.5 (3) MG/3ML IN SOLN
3.0000 mL | RESPIRATORY_TRACT | Status: DC
  Administered 2021-05-16: 3 mL via RESPIRATORY_TRACT
  Filled 2021-05-16 (×2): qty 3

## 2021-05-16 MED ORDER — METHYLPREDNISOLONE SODIUM SUCC 40 MG IJ SOLR
40.0000 mg | Freq: Two times a day (BID) | INTRAMUSCULAR | Status: DC
  Administered 2021-05-16 – 2021-05-17 (×2): 40 mg via INTRAVENOUS
  Filled 2021-05-16 (×2): qty 1

## 2021-05-16 MED ORDER — BUDESONIDE 0.5 MG/2ML IN SUSP
0.5000 mg | Freq: Two times a day (BID) | RESPIRATORY_TRACT | Status: DC
  Administered 2021-05-16: 0.5 mg via RESPIRATORY_TRACT
  Filled 2021-05-16: qty 2

## 2021-05-16 MED ORDER — ACETAMINOPHEN 325 MG PO TABS: 325.0000 mg | ORAL_TABLET | ORAL | Status: DC | PRN

## 2021-05-16 MED ORDER — ONDANSETRON HCL 4 MG/2ML IJ SOLN: 4.0000 mg | Freq: Four times a day (QID) | INTRAMUSCULAR | Status: DC | PRN

## 2021-05-16 MED ORDER — SODIUM CHLORIDE 0.9 % IV BOLUS: 1000.0000 mL | Freq: Once | INTRAVENOUS | Status: DC

## 2021-05-16 MED ORDER — HALOPERIDOL 0.5 MG PO TABS
0.5000 mg | ORAL_TABLET | ORAL | Status: DC | PRN
  Filled 2021-05-16: qty 1

## 2021-05-16 MED ORDER — METHYLPREDNISOLONE SODIUM SUCC 125 MG IJ SOLR
125.0000 mg | Freq: Once | INTRAMUSCULAR | Status: AC
  Administered 2021-05-16: 125 mg via INTRAVENOUS
  Filled 2021-05-16: qty 2

## 2021-05-16 MED ORDER — HALOPERIDOL LACTATE 2 MG/ML PO CONC
0.5000 mg | ORAL | Status: DC | PRN
  Filled 2021-05-16: qty 0.3

## 2021-05-16 MED ORDER — DIPHENHYDRAMINE HCL 50 MG/ML IJ SOLN: 12.5000 mg | INTRAMUSCULAR | Status: DC | PRN

## 2021-05-16 MED ORDER — ONDANSETRON 4 MG PO TBDP
4.0000 mg | ORAL_TABLET | Freq: Four times a day (QID) | ORAL | Status: DC | PRN
  Filled 2021-05-16: qty 1

## 2021-05-16 MED ORDER — LORAZEPAM 2 MG/ML PO CONC: 1.0000 mg | ORAL | Status: DC | PRN

## 2021-05-16 MED ORDER — FAMOTIDINE IN NACL 20-0.9 MG/50ML-% IV SOLN: 20.0000 mg | INTRAVENOUS | Status: DC

## 2021-05-16 MED ORDER — SODIUM CHLORIDE 0.9 % IV BOLUS
1000.0000 mL | Freq: Once | INTRAVENOUS | Status: AC
  Administered 2021-05-16: 1000 mL via INTRAVENOUS

## 2021-05-16 MED ORDER — POLYETHYLENE GLYCOL 3350 17 G PO PACK: 17.0000 g | PACK | Freq: Every day | ORAL | Status: DC | PRN

## 2021-05-16 NOTE — ED Triage Notes (Signed)
Pt from [facility] via [place], pt was being evicted today and found to be in bed for at least 5 days with diarrhea for at least 5 days. Pt noted to have distended abdomen and audible wheezing.

## 2021-05-16 NOTE — ED Notes (Signed)
Four family members arrived at bedside and requesting to speak with physician. Dr. Belia Heman talking with them over phone at this time.

## 2021-05-16 NOTE — ED Notes (Signed)
Webb Silversmith, NP at bedside at this time.

## 2021-05-16 NOTE — H&P (Signed)
NAME:  Robert Atkinson, MRN:  [id number], DOB:  [birth year], LOS: 0 ADMISSION DATE:  05/14/2021  History of Present Illness:  45 y.o. male with only known PMH as below who presents by EMS from a hotel room with shortness of breath and diarrhea over the last several days.    Per EMS, the patient was staying at a motel and was to be evicted today when the hotel staff found him.    The patient reports diarrhea for the last several days and has been but currently primary complaints of shortness of breath.  He also has some abdominal pain and distention.  Diagnosis is acute renal failure acute liver failure and respiratory failure with septic shock  Patient is at very high risk for cardiac arrest and death Patient moaning and  groaning in pain Able to communicate but lethargic Unable to straighten out legs Spider angiomas all over his body   CT ABD Very abnormal appearance of the liver by noncontrast imaging. Difficult to exclude underlying cirrhosis and multifocal hepatic process or lesions.   Large volume of abdominopelvic ascites.   Suspect some degree of small and large bowel scattered wall thickening suggesting enterocolitis. No associated obstruction pattern or ileus. Negative for free air.  ER COURSE GIVEN 4L IVF's Given NEB therapy   Pertinent  Medical History  +ETOH +tobacco abuse  Significant Hospital Events: Including procedures, antibiotic start and stop dates in addition to other pertinent events   12/13 severe multiorgan failure     Micro Data:  RESP VIRAL PANEL NEG  Antimicrobials:   Antibiotics Given (last 72 hours)     Date/Time Action Medication Dose Rate   05/18/2021 1410 New Bag/Given   ceFEPIme (MAXIPIME) 2 g in sodium chloride 0.9 % 100 mL IVPB 2 g 200 mL/hr   05/08/2021 1532 New Bag/Given   vancomycin (VANCOCIN) IVPB 1000 mg/200 mL premix 1,000 mg 200 mL/hr   06/01/2021 1535 New Bag/Given   metroNIDAZOLE (FLAGYL) IVPB 500 mg 500 mg 100 mL/hr             Objective   Blood pressure 96/79, pulse 76, temperature (!) 97.4 F (36.3 C), temperature source Rectal, resp. rate (!) 22, height 6\' 1"  (1.854 m), weight 60 kg, SpO2 97 %.       No intake or output data in the 24 hours ending 05/28/2021 1727 Filed Weights   05/15/2021 1257  Weight: 60 kg      REVIEW OF SYSTEMS  PATIENT IS UNABLE TO PROVIDE COMPLETE REVIEW OF SYSTEMS DUE TO SEVERE CRITICAL ILLNESS    PHYSICAL EXAMINATION:  GENERAL:critically ill appearing, +resp distress EYES: Pupils equal, round, reactive to light.  No scleral icterus.  MOUTH: Moist mucosal membrane. INTUBATED NECK: Supple.  PULMONARY: +rhonchi, +wheezing CARDIOVASCULAR: S1 and S2.  No murmurs  GASTROINTESTINAL: +pain all over +distended. Positive bowel sounds.  MUSCULOSKELETAL: No swelling, clubbing, or edema.  NEUROLOGIC: lethragic  SKIN:contracted LE     Labs/imaging that I havepersonally reviewed  (right click and "Reselect all SmartList Selections" daily)       ASSESSMENT AND PLAN SYNOPSIS  45 yo white male with severe ETOH abuse with acute liver failure with acute renal failure with acute viral enterocolitis and gastroenteritis with severe resp distress Patient has multiorgan failure and high risk for cardiac arrest and death   Severe ACUTE Hypoxic and Hypercapnic Respiratory Failure Start IV steroids Oxygen as needed      SEVERE COPD EXACERBATION -continue IV steroids as prescribed -continue NEB  THERAPY as prescribed -morphine as needed -wean fio2 as needed and tolerated   SEVERE ALCOHOL ABUSE -Therapy with Thiamine and MVI -High risk for intubation  -high risk for aspiration   CARDIAC ICU monitoring   ACUTE KIDNEY INJURY/Renal Failure -continue Foley Catheter-assess need -Avoid nephrotoxic agents -Follow urine output, BMP -Ensure adequate renal perfusion, optimize oxygenation -Renal dose medications  No intake or output data in the 24 hours ending  05/07/2021 1727   NEUROLOGY Acute toxic metabolic encephalopathy    SEPTIC SHOCK SOURCE-ABD gastroenteritis -follow ABG and LA -follow up cultures -emperic ABX -consider stress dose steroids -aggressive IV fluid resuscitation  INFECTIOUS DISEASE -continue antibiotics as prescribed -follow up cultures   GI GI PROPHYLAXIS as indicated  NUTRITIONAL STATUS DIET-->NPO Constipation protocol as indicated   ELECTROLYTES -follow labs as needed -replace as needed -pharmacy consultation and following   ACUTE ANEMIA- TRANSFUSE AS NEEDED CONSIDER TRANSFUSION  IF HGB<7 DVT PRX with TED/SCD's ONLY     Best practice (right click and "Reselect all SmartList Selections" daily)  Diet: NPO GI prophylaxis: H2B Mobility:  bed rest  Code Status:  FULL Disposition:ICU  Labs   CBC: Recent Labs  Lab 05/30/2021 1300  WBC 11.9*  NEUTROABS 7.5  HGB 11.6*  HCT 31.6*  MCV 101.3*  PLT 135*    Basic Metabolic Panel: Recent Labs  Lab 05/04/2021 1300  NA 121*  K 4.5  CL 90*  CO2 17*  GLUCOSE 76  BUN 69*  CREATININE 4.97*  CALCIUM 7.7*   GFR: Estimated Creatinine Clearance: 15.8 mL/min (A) (by C-G formula based on SCr of 4.97 mg/dL (H)). Recent Labs  Lab 05/06/2021 1300 05/18/2021 1308  WBC 11.9*  --   LATICACIDVEN 3.4* 2.6*    Liver Function Tests: Recent Labs  Lab 05/15/2021 1300  AST 118*  ALT 41  ALKPHOS 66  BILITOT 6.6*  PROT 6.9  ALBUMIN 2.3*   Recent Labs  Lab 05/30/2021 1300  LIPASE 83*   Recent Labs  Lab 05/14/2021 1300  AMMONIA 85*    ABG    Component Value Date/Time   HCO3 14.6 (L) 05/26/2021 1257   ACIDBASEDEF 8.1 (H) 05/21/2021 1257   O2SAT 85.5 05/31/2021 1257     Coagulation Profile: Recent Labs  Lab 05/27/2021 1340  INR 1.8*    Cardiac Enzymes: Recent Labs  Lab 05/08/2021 1300  CKTOTAL 45*    HbA1C: No results found for: HGBA1C  CBG: No results for input(s): GLUCAP in the last 168 hours.   Past Medical History:  He,  has  a past medical history of Polycythemia and Seizures (HCC).   Surgical History:  History reviewed. No pertinent surgical history.   Social History:   reports that he has been smoking cigarettes. He has been smoking an average of 1 pack per day. He has never used smokeless tobacco. He reports current alcohol use of about 6.0 standard drinks per week. He reports that he does not use drugs.   Family History:  His family history is not on file.   Allergies Allergies  Allergen Reactions   Penicillins      Home Medications  Prior to Admission medications   Not on File       DVT/GI PRX  assessed I Assessed the need for Labs I Assessed the need for Foley I Assessed the need for Central Venous Line Family Discussion when available I Assessed the need for Mobilization I made an Assessment of medications to be adjusted accordingly Safety Risk assessment completed  CASE DISCUSSED IN MULTIDISCIPLINARY ROUNDS WITH ICU TEAM     Critical Care Time devoted to patient care services described in this note is 75 minutes.   Critical care was necessary to treat /prevent imminent and life-threatening deterioration.   PATIENT WITH VERY POOR PROGNOSIS I ANTICIPATE PROLONGED ICU LOS  Patient is critically ill. Patient with Multiorgan failure and at high risk for cardiac arrest and death.    Lucie Leather, M.D.  [facility]  Medical Director [facility] Medical Director [facility] Cardio-Pulmonary Department

## 2021-05-16 NOTE — ED Notes (Signed)
Respiratory called at this time to notify of VBG in lab

## 2021-05-16 NOTE — Progress Notes (Signed)
Notified provider and bedside nurse of need to order and draw repeat lactic acid #3.  

## 2021-05-16 NOTE — Progress Notes (Signed)
I Spoke to family over the phone with son and niece and sisters of the patient. Patient has  multi organ failure with liver failure, renal failure, restaurant failure, and cardiac failure and at this time his prognosis is grave and patient is a very high risk for cardiac arrest at this time the whole family has consented and agree to a DNR status along with DNI status. They are interested in making him comfortable and we will prescribe  pain meds as needed but continue medical care as deemed fit.

## 2021-05-16 NOTE — Consult Note (Signed)
PHARMACY -  BRIEF ANTIBIOTIC NOTE   Pharmacy has received consult(s) for cefepime and vancomycin from an ED provider. Patient is also ordered metronidazole. The patient's profile has been reviewed for ht/wt/allergies/indication/available labs.    One time order(s) placed for --Cefepime 2 g IV --Vancomycin 1 g IV  Further antibiotics/pharmacy consults should be ordered by admitting physician if indicated.                       Thank you, Tressie Ellis 05/30/2021  2:13 PM

## 2021-05-16 NOTE — ED Provider Notes (Signed)
[facility] Emergency Department Provider Note ____________________________________________   Event Date/Time   First MD Initiated Contact with Patient 05/23/2021 1255     (approximate)  I have reviewed the triage vital signs and the nursing notes.   HISTORY  Chief Complaint Abdominal Pain  Level 5 caveat: History of present illness limited due to disorganized historian  HPI Robert Atkinson is a 45 y.o. male with only known PMH as below who presents by EMS from a hotel room with shortness of breath and diarrhea over the last several days.  Per EMS, the patient was staying at a motel and was to be evicted today when the hotel staff found him.  The patient reports diarrhea for the last several days and has been but currently primary complaints of shortness of breath.  He also has some abdominal pain and distention.  Past Medical History:  Diagnosis Date   Polycythemia    Seizures ([place])     There are no problems to display for this patient.   History reviewed. No pertinent surgical history.  Prior to Admission medications   Not on File    Allergies Penicillins  History reviewed. No pertinent family history.  Social History Social History   Tobacco Use   Smoking status: Every Day    Packs/day: 1.00    Types: Cigarettes   Smokeless tobacco: Never  Substance Use Topics   Alcohol use: Yes    Alcohol/week: 6.0 standard drinks    Types: 6 Cans of beer per week    Comment: daily   Drug use: Never    Review of Systems  Constitutional: No fever. Cardiovascular: Denies chest pain. Respiratory: Positive for shortness of breath. Gastrointestinal: Positive for diarrhea.  Neurological: Negative for headache.   ____________________________________________   PHYSICAL EXAM:  VITAL SIGNS: ED Triage Vitals  Enc Vitals Group     BP 05/26/2021 1308 (!) 96/55     Pulse Rate 05/22/2021 1304 86     Resp 05/04/2021 1304 (!) 24     Temp 05/09/2021 1304  98.1 F (36.7 C)     Temp Source 05/17/2021 1304 Axillary     SpO2 05/22/2021 1255 99 %     Weight 05/09/2021 1257 132 lb 4.4 oz (60 kg)     Height 05/09/2021 1257 6\' 1"  (1.854 m)     Head Circumference --      Peak Flow --      Pain Score 05/10/2021 1256 10     Pain Loc --      Pain Edu? --      Excl. in [place]? --     Constitutional: Alert and oriented.  Ill-appearing. Eyes: Conjunctivae are injected bilaterally.  EOMI.  PERRLA. Head: Atraumatic. Nose: No congestion/rhinnorhea. Mouth/Throat: Mucous membranes are dry. Neck: Normal range of motion.  Cardiovascular: Normal rate, regular rhythm. Grossly normal heart sounds.  Good peripheral circulation. Respiratory: Increased respiratory effort with some accessory muscle use.  Diffuse wheezing and rhonchi bilaterally. Gastrointestinal: Distended abdomen, soft and nontender.  Genitourinary: No flank tenderness. Musculoskeletal: No lower extremity edema.  Extremities warm and well perfused.  Neurologic:  Normal speech and language.  Motor intact in all extremities. Skin:  Skin is warm and dry. No rash noted. Psychiatric: Anxious appearing.  ____________________________________________   LABS (all labs ordered are listed, but only abnormal results are displayed)  Labs Reviewed  BASIC METABOLIC PANEL - Abnormal; Notable for the following components:      Result Value   Sodium  121 (*)    Chloride 90 (*)    CO2 17 (*)    BUN 69 (*)    Creatinine, Ser 4.97 (*)    Calcium 7.7 (*)    GFR, Estimated 14 (*)    All other components within normal limits  HEPATIC FUNCTION PANEL - Abnormal; Notable for the following components:   Albumin 2.3 (*)    AST 118 (*)    Total Bilirubin 6.6 (*)    Bilirubin, Direct 3.0 (*)    Indirect Bilirubin 3.6 (*)    All other components within normal limits  LACTIC ACID, PLASMA - Abnormal; Notable for the following components:   Lactic Acid, Venous 2.6 (*)    All other components within normal limits  LIPASE,  BLOOD - Abnormal; Notable for the following components:   Lipase 83 (*)    All other components within normal limits  CBC WITH DIFFERENTIAL/PLATELET - Abnormal; Notable for the following components:   WBC 11.9 (*)    RBC 3.12 (*)    Hemoglobin 11.6 (*)    HCT 31.6 (*)    MCV 101.3 (*)    MCH 37.2 (*)    MCHC 36.7 (*)    RDW 16.0 (*)    Platelets 135 (*)    All other components within normal limits  BLOOD GAS, VENOUS - Abnormal; Notable for the following components:   pCO2, Ven 22 (*)    pO2, Ven 49.0 (*)    Bicarbonate 14.6 (*)    Acid-base deficit 8.1 (*)    All other components within normal limits  AMMONIA - Abnormal; Notable for the following components:   Ammonia 85 (*)    All other components within normal limits  CK - Abnormal; Notable for the following components:   Total CK 45 (*)    All other components within normal limits  PROTIME-INR - Abnormal; Notable for the following components:   Prothrombin Time 21.1 (*)    INR 1.8 (*)    All other components within normal limits  RESP PANEL BY RT-PCR (FLU A&B, COVID) ARPGX2  CULTURE, BLOOD (ROUTINE X 2)  CULTURE, BLOOD (ROUTINE X 2)  ETHANOL  LACTIC ACID, PLASMA  URINALYSIS, ROUTINE W REFLEX MICROSCOPIC  URINE DRUG SCREEN, QUALITATIVE ([facility] ONLY)  TROPONIN I (HIGH SENSITIVITY)  TROPONIN I (HIGH SENSITIVITY)   ____________________________________________  EKG  ED ECG REPORT I, Arta Silence, the attending physician, personally viewed and interpreted this ECG.  Date: 06/01/2021 EKG Time: 1307 Rate: 84 Rhythm: normal sinus rhythm QRS Axis: normal Intervals: Prolonged QTc ST/T Wave abnormalities: Nonspecific T wave abnormalities Narrative Interpretation: Nonspecific abnormalities with no evidence of acute ischemia  ____________________________________________  RADIOLOGY  Chest x-ray interpreted by me shows no focal consolidation or edema  CT abdomen/pelvis:  IMPRESSION:  Limited exam without IV and  oral contrast.     Very abnormal appearance of the liver by noncontrast imaging.  Difficult to exclude underlying cirrhosis and multifocal hepatic  process or lesions.     Large volume of abdominopelvic ascites.     Suspect some degree of small and large bowel scattered wall  thickening suggesting enterocolitis. No associated obstruction  pattern or ileus. Negative for free air.     Aortic Atherosclerosis (ICD10-I70.0).    ____________________________________________   PROCEDURES  Procedure(s) performed: No  Procedures  Critical Care performed: Yes  CRITICAL CARE Performed by: Arta Silence   Total critical care time: 40 minutes  Critical care time was exclusive of separately billable procedures and  treating other patients.  Critical care was necessary to treat or prevent imminent or life-threatening deterioration.  Critical care was time spent personally by me on the following activities: development of treatment plan with patient and/or surrogate as well as nursing, discussions with consultants, evaluation of patient's response to treatment, examination of patient, obtaining history from patient or surrogate, ordering and performing treatments and interventions, ordering and review of laboratory studies, ordering and review of radiographic studies, pulse oximetry and re-evaluation of patient's condition.  ____________________________________________   INITIAL IMPRESSION / ASSESSMENT AND PLAN / ED COURSE  Pertinent labs & imaging results that were available during my care of the patient were reviewed by me and considered in my medical decision making (see chart for details).   45 year old male with only documented PMH of polycythemia and seizures presents with diarrhea and shortness of breath after hotel staff attempted to evict him.  I reviewed the past medical records in Epic, and his last ED visit was in 2020 for foot pain.  There are no relevant records in Care  Everywhere.  On exam, the patient is chronically ill-appearing, cachectic, with pale skin and a distended abdomen.  He is hypoxic on room air and borderline hypotensive, but afebrile with normal pulse.  Conjunctiva are injected bilaterally.  He has increased work of breathing and lung exam reveals diffuse wheezing.  The abdomen is nontender.  Mucous membranes are dry.  Differential for the patient's presentation is extremely broad.  Shortness of breath is concerning for pneumonia, influenza or other viral etiology, acute bronchitis, or possible new onset COPD, CHF/fluid overload.  Possible causes of the diarrhea include colitis, diverticulitis, gastroenteritis, or noninfectious cause.  I am also concerned for new onset cirrhosis given the distended abdomen.  We will obtain lab work-up including LFTs, coags, ammonia, sepsis work-up, cardiac enzymes, as well as chest x-ray and CT abdomen/pelvis.  I anticipate admission.  ----------------------------------------- 3:51 PM on June 08, 2021 -----------------------------------------  Lab work-up reveals multiorgan dysfunction including AKI, elevated LFTs and INR concerning for liver failure, hyponatremia, and an elevated ammonia.  The lactate is elevated and the patient remains hypotensive.  I have ordered IV antibiotics for presumed sepsis.  On reassessment, the patient's respiratory status has improved significantly.  O2 saturation is in the high 90s on 3 L by nasal cannula.  He still has some wheezing.  CT shows ascites and likely liver cirrhosis with no evidence of obstruction.  I consulted Dr. Belia Heman from the ICU to evaluate the patient given the persistent hypotension.  He recommended giving 2 additional liters of lactated Ringer's and will come to evaluate the patient.  I signed the patient out to the oncoming ED physician Dr. Larinda Buttery.    ____________________________________________   FINAL CLINICAL IMPRESSION(S) / ED DIAGNOSES  Final diagnoses:   None      NEW MEDICATIONS STARTED DURING THIS VISIT:  New Prescriptions   No medications on file     Note:  This document was prepared using Dragon voice recognition software and may include unintentional dictation errors.    Dionne Bucy, MD 2021/06/08 1553

## 2021-05-16 NOTE — ED Notes (Signed)
Patient verbalizes desire for comfort measures.

## 2021-05-16 NOTE — Consult Note (Signed)
Pharmacy Antibiotic Note  Robert Atkinson is a 45 y.o. male admitted on 05/29/2021 with SOB and diarrhea with large volume abominopelvic ascites c/f SBP. Pharmacy has been consulted for cefepime dosing.  Plan: Cefepime 2 gram Q24H given decrease in renal function  Height: 6\' 1"  (185.4 cm) Weight: 60 kg (132 lb 4.4 oz) IBW/kg (Calculated) : 79.9  Temp (24hrs), Avg:97.8 F (36.6 C), Min:97.4 F (36.3 C), Max:98.1 F (36.7 C)  Recent Labs  Lab 05/23/2021 1300 06/02/2021 1308  WBC 11.9*  --   CREATININE 4.97*  --   LATICACIDVEN 3.4* 2.6*    Estimated Creatinine Clearance: 15.8 mL/min (A) (by C-G formula based on SCr of 4.97 mg/dL (H)).    Allergies  Allergen Reactions   Penicillins     Antimicrobials this admission: 12/ 13 >> Vanco x 1 12/13 > Metronidazole x1 12/13 cefepime >>   Dose adjustments this admission:   Microbiology results: 12/13 BCx: sent   Thank you for allowing pharmacy to be a part of this patients care.  1/14, PharmD, BCPS Clinical Pharmacist   05/04/2021 5:27 PM

## 2021-05-16 NOTE — Progress Notes (Signed)
CODE SEPSIS - PHARMACY COMMUNICATION  **Broad Spectrum Antibiotics should be administered within 1 hour of Sepsis diagnosis**  Time Code Sepsis Called/Page Received: 1357  Antibiotics Ordered: Cefepime + vancomycin + metronidazole  Time of 1st antibiotic administration: 1410  Additional action taken by pharmacy: N/A  Robert Atkinson 2021/06/09  2:16 PM

## 2021-05-16 NOTE — Consult Note (Addendum)
PHARMACY CONSULT NOTE - FOLLOW UP  Pharmacy Consult for Electrolyte Monitoring and Replacement   Recent Labs: Potassium (mmol/L)  Date Value  06/15/21 4.5   Magnesium (mg/dL)  Date Value  15/18/3437 2.0   Calcium (mg/dL)  Date Value  35/78/9784 7.7 (L)   Albumin (g/dL)  Date Value  78/41/2820 2.3 (L)   Phosphorus (mg/dL)  Date Value  81/38/8719 4.1   Sodium (mmol/L)  Date Value  06-15-2021 121 (L)   Corrected Ca: 9.1  Assessment: 45 y.o. male presented on 15-Jun-2021 by EMS from a hotel room with shortness of breath and diarrhea. Found to have acute renal, liver, respiratory failure with septic shock. Pharmacy has been consulted to monitor and replace electrolytes as needed  Goal of Therapy:  Electrolytes WNL  Plan:  Hyponatremia 2/2 GI loss vs volume overload? F/u correction per MD 2L LR and 1L NS boluses given in ED Recheck electrolytes with AM labs   Sharen Hones ,PharmD, BCPS Clinical Pharmacist 15-Jun-2021 7:21 PM

## 2021-05-16 NOTE — Progress Notes (Signed)
Notified bedside nurse of need to draw repeat lactic acid. 

## 2021-05-16 NOTE — Progress Notes (Signed)
Notified bedside nurse of need to draw repeat lactic acid (#3). 

## 2021-05-16 NOTE — ED Notes (Signed)
Lactic of 2.6 passed on to MD Siadecki at this time.

## 2021-05-16 NOTE — Progress Notes (Signed)
Elink monitoring code sepsis 

## 2021-05-17 DIAGNOSIS — Z7189 Other specified counseling: Secondary | ICD-10-CM

## 2021-05-17 DIAGNOSIS — K7201 Acute and subacute hepatic failure with coma: Secondary | ICD-10-CM

## 2021-05-17 NOTE — Progress Notes (Signed)
GOALS OF CARE     Advance Care Planning/Goals of Care discussion was performed during the course of treatment to decide on type of care right for this patient following admission to the ICU.   I met with patient's family ( sister, ex-wife and his son) to discuss goals of care in details following review of patient's lab and diagnostic data, ED Physician's report and the consulting  notes. I reviewed/imaging with the family at the bedside and answered all their question.  Discussed prognosis, expected outcome with or without ongoing aggressive treatments and the options for de-escalation of care.   Diagnosis(es): acute liver failure with acute renal failure with acute viral enterocolitis and gastroenteritis with severe resp distress Prognosis: Poor Code Status: DNR Disposition: ICU Next Steps:Will change status from ICU to medsurg. Family understands the situation. They have consented and agreed to DNR/DNI and would not wish to pursue any aggressive treatment.  Family has consented to proceed with transition to full comfort care measures.  Family are satisfied with Plan of action and management. All questions answered     Total Time Spent Face to Face addressing advance care planning in the presence of the Patient: 25 minutes minutes       Rufina Falco, DNP, FNP-C, AGACNP-BC Acute Care Nurse Practitioner [facility] Pager: [phone] [place] [facility]

## 2021-05-17 NOTE — Progress Notes (Signed)
GOALS OF CARE DISCUSSION  The Clinical status was relayed to family in detail. All family at bedside Updated and notified of patients medical condition.    Patient remains unresponsive and will not open eyes to command.   Patient is having a weak cough and struggling to remove secretions.   Patient with increased WOB and using accessory muscles to breathe Explained to family course of therapy and the modalities    Patient with Progressive multiorgan failure with a very high probablity of a very minimal chance of meaningful recovery despite all aggressive and optimal medical therapy.    PATIENT REMAINS COMFORT CARE MEASURES ON MORPHINE INFUSION Seems to be very comfortable, not in any pain or suffering.  Family understands the situation.   Family are satisfied with Plan of action and management. All questions answered    Lucie Leather, M.D.  [facility]  Medical Director [facility] Medical Director [facility] Cardio-Pulmonary Department

## 2021-05-17 NOTE — TOC Initial Note (Signed)
Transition of Care [facility]) - Initial/Assessment Note    Patient Details  Name: Robert Atkinson MRN: [id number] Date of Birth: 05-Mar-1976  Transition of Care [facility]) CM/SW Contact:    Hetty Ely, RN Phone Number: 05/17/2021, 3:11 PM  Clinical Narrative:   Transition of Care [facility]) Screening Note   Patient Details  Name: Robert Atkinson Date of Birth: 1975/09/02   Transition of Care [facility]) CM/SW Contact:    Hetty Ely, RN Phone Number: 05/17/2021, 3:11 PM    Transition of Care Department [facility]) has reviewed patient and no TOC needs have been identified at this time. We will continue to monitor patient advancement through interdisciplinary progression rounds. If new patient transition needs arise, please place a TOC consult.                         Patient Goals and CMS Choice        Expected Discharge Plan and Services                                                Prior Living Arrangements/Services                       Activities of Daily Living Home Assistive Devices/Equipment: Dan Humphreys (specify type) ADL Screening (condition at time of admission) Patient's cognitive ability adequate to safely complete daily activities?: Yes Is the patient deaf or have difficulty hearing?: No Does the patient have difficulty seeing, even when wearing glasses/contacts?: No Does the patient have difficulty concentrating, remembering, or making decisions?: No Patient able to express need for assistance with ADLs?: Yes Does the patient have difficulty dressing or bathing?: Yes Independently performs ADLs?: No Communication: Independent Dressing (OT): Dependent Is this a change from baseline?: Change from baseline, expected to last >3 days Grooming: Dependent Is this a change from baseline?: Change from baseline, expected to last >3 days Feeding: Dependent Is this a change from baseline?: Change from baseline, expected to last >3 days Bathing:  Dependent Is this a change from baseline?: Change from baseline, expected to last >3 days Toileting: Dependent Is this a change from baseline?: Change from baseline, expected to last >3days In/Out Bed: Dependent Is this a change from baseline?: Change from baseline, expected to last >3 days Walks in Home: Dependent Is this a change from baseline?: Change from baseline, expected to last >3 days Does the patient have difficulty walking or climbing stairs?: Yes Weakness of Legs: Both Weakness of Arms/Hands: Both  Permission Sought/Granted                  Emotional Assessment              Admission diagnosis:  Liver failure (HCC) [K72.90] Patient Active Problem List   Diagnosis Date Noted   Liver failure (HCC) 05/29/2021   PCP:  Patient, [name] Pharmacy:  No Pharmacies Listed    Social Determinants of Health (SDOH) Interventions    Readmission Risk Interventions No flowsheet data found.

## 2021-05-17 NOTE — Consult Note (Signed)
Consultation Note Date: 05/17/2021   Patient Name: Robert Atkinson  DOB: 1976-01-12  MRN: [id number]  Age / Sex: 45 y.o., male  PCP: Patient, [name] Referring Physician: Erin Fulling, MD  Reason for Consultation: Establishing goals of care  HPI/Patient Profile: SIRUS LABRIE is a 45 y.o. male with only known PMH as below who presents by EMS from a hotel room with shortness of breath and diarrhea over the last several days.  Per EMS, the patient was staying at a motel and was to be evicted today when the hotel staff found him.  The patient reports diarrhea for the last several days and has been but currently primary complaints of shortness of breath.  He also has some abdominal pain and distention.  Clinical Assessment and Goals of Care: Patient is resting in bed. Family members including patient's mother is at bedside. Patient is currently on comfort care with orders already in place. He appears comfortable with eyes closed. Even and unlabored respirations.  Patient and mother have been living together in a motel. Per mother patient drinks around 6 beers per day. Other family members state patient drinks large amounts of ETOH, and they all agree he smokes marijuana daily. Per the family he has not wanted to seek medical care for any issues until prior to this admission. They state he has had bruising and rapid weight loss since around October.   Mother and family have been kept updated and are able to accurately articulate his status and plans. Discussed prognosis and that he does not appear to be stable to move to the hospice facility. Patient will remain at [facility] for end of life process. Mother and family would like for him to be kept as comfortable as possible throughout his end of life process.   Daughter came out into hall to speak with me. She discusses very tenuous family dynamics and  history, and that the family has distanced themselves from the patient due to his use. They have distanced from mother as well. She discusses their homelessness, and eviction from their motel room.     SUMMARY OF RECOMMENDATIONS   Agree with current comfort care orders already in place.  Patient does not appear to be stable to transfer to hospice facility. Anticipated hospital death.   Prognosis:  Hours - Days       Primary Diagnoses: Present on Admission:  Liver failure (HCC)   I have reviewed the medical record, interviewed the patient and family, and examined the patient. The following aspects are pertinent.  Past Medical History:  Diagnosis Date   Polycythemia    Seizures (HCC)    Social History   Socioeconomic History   Marital status: Single    Spouse name: Not on file   Number of children: Not on file   Years of education: Not on file   Highest education level: Not on file  Occupational History   Not on file  Tobacco Use   Smoking status: Every Day  Packs/day: 1.00    Types: Cigarettes   Smokeless tobacco: Never  Substance and Sexual Activity   Alcohol use: Yes    Alcohol/week: 6.0 standard drinks    Types: 6 Cans of beer per week    Comment: daily   Drug use: Never   Sexual activity: Not on file  Other Topics Concern   Not on file  Social History Narrative   Not on file   Social Determinants of Health   Financial Resource Strain: Not on file  Food Insecurity: Not on file  Transportation Needs: Not on file  Physical Activity: Not on file  Stress: Not on file  Social Connections: Not on file   History reviewed. No pertinent family history. Scheduled Meds:  mouth rinse  15 mL Mouth Rinse BID   sodium chloride flush  3 mL Intravenous Q12H   Continuous Infusions:  sodium chloride     morphine 3 mg/hr (05/17/21 0310)   PRN Meds:.sodium chloride, acetaminophen, antiseptic oral rinse, diphenhydrAMINE, docusate sodium, glycopyrrolate **OR**  glycopyrrolate **OR** glycopyrrolate, haloperidol **OR** haloperidol **OR** haloperidol lactate, ipratropium-albuterol, LORazepam **OR** LORazepam **OR** LORazepam, LORazepam, morphine injection, ondansetron **OR** ondansetron (ZOFRAN) IV, sodium chloride flush Medications Prior to Admission:  Prior to Admission medications   Not on File   Allergies  Allergen Reactions   Penicillins    Review of Systems  Unable to perform ROS  Physical Exam Constitutional:      Comments: Eyes closed.   Pulmonary:     Comments: Even and unlabored.   Vital Signs: BP (!) 85/57 (BP Location: Left Arm)    Pulse 84    Temp (!) 97.4 F (36.3 C) (Rectal)    Resp 11    Ht 6\' 1"  (1.854 m)    Wt 60 kg    SpO2 97%    BMI 17.45 kg/m  Pain Scale: PAINAD   Pain Score: Asleep   SpO2: SpO2: 97 % O2 Device:SpO2: 97 % O2 Flow Rate: .O2 Flow Rate (L/min): 3 L/min  IO: Intake/output summary:  Intake/Output Summary (Last 24 hours) at 05/17/2021 1448 Last data filed at 05/17/2021 0310 Gross per 24 hour  Intake 51.09 ml  Output --  Net 51.09 ml    LBM: Last BM Date:  (unknown) Baseline Weight: Weight: 60 kg Most recent weight: Weight: 60 kg       Time In: 1:25 Time Out: 2:00 Time Total: 35 min Greater than 50%  of this time was spent counseling and coordinating care related to the above assessment and plan.  Signed by: 05/25/2021, NP   Please contact Palliative Medicine Team phone at [phone] for questions and concerns.  For individual provider: See [phone]

## 2021-05-17 NOTE — Progress Notes (Signed)
Nutrition Brief Note  Chart reviewed. Pt now transitioning to comfort care.  No further nutrition interventions planned at this time.  Please re-consult as needed.   Ami Mally W, RD, LDN, CDCES Registered Dietitian II Certified Diabetes Care and Education Specialist Please refer to [facility] for RD and/or RD on-call/weekend/after hours pager   

## 2021-05-17 NOTE — Progress Notes (Signed)
  Chaplain On-Call received a call from Case Manager Elana, who reported that the patient's condition is declining and family members are present at the bedside.  Chaplain arrived on the Unit and received medical update from [place].  Chaplain met the patient's son Reynoldo and niece Tanzania in the room. They stated that the patient has acute liver failure, and that the chances for his recovery are doubtful.  Chaplain offered time for them to speak about the patient's life and his relationship with them.  Chaplain provided spiritual and emotional support, and assured the family of the availability of Chaplains as needed.  Chaplain Pollyann Samples M.Div., [facility]

## 2021-05-18 DIAGNOSIS — Z515 Encounter for palliative care: Secondary | ICD-10-CM

## 2021-05-18 DIAGNOSIS — N179 Acute kidney failure, unspecified: Secondary | ICD-10-CM

## 2021-05-18 DIAGNOSIS — F10129 Alcohol abuse with intoxication, unspecified: Secondary | ICD-10-CM

## 2021-05-18 NOTE — Progress Notes (Signed)
°   05/18/21 1130  Clinical Encounter Type  Visited With Patient  Visit Type Initial;Spiritual support;Social support  Referral From Nurse  Consult/Referral To Chaplain   Chaplain offered emotional and spiritual support. Family was having a hard time and very tearful. Chaplain ministered with prayer, reflective listening, and intentional presence.

## 2021-05-18 NOTE — Progress Notes (Signed)
PROGRESS NOTE    TYEE VANDEVOORDE  SHF:[id number] DOB: 10-04-1975 DOA: 05/22/2021 PCP: Patient, [name]   Assessment & Plan:   Principal Problem:   Liver failure (HCC)  Acute hypoxic & hypercapnic respiratory failure: continue w/ comfort care only  COPD exacerbation: severe. Continue w/ comfort care only  Alcohol abuse: severe. Continue w/ comfort care only   Acute liver failure: likely secondary to severe alcohol abuse. Continue w/ comfort care only  AKI: continue w/ comfort care only   Acute toxic metabolic encephalopathy: likely secondary to alcohol abuse. Continue w/ comfort care only  Enterocolitis: continue w/ comfort care only    DVT prophylaxis: comfort care  Code Status: DNR Family Communication: discussed pt's care w/ pt's family at bedside and answered their questions  Disposition Plan: likely in hospital death   Level of care: Med-Surg  Status is: Inpatient  Remains inpatient appropriate because: comfort care only      Consultants:  ICU Hospice   Procedures:   Antimicrobials:   Subjective: Pt is obtunded   Objective: Vitals:   06/03/2021 1830 05/24/2021 1930 05/12/2021 2000 05/17/21 0818  BP: 93/64 91/61 92/63  (!) 85/57  Pulse: 79 81 79 84  Resp: (!) 21 20 20 11   Temp:      TempSrc:      SpO2: 99% 97% 98% 97%  Weight:      Height:       No intake or output data in the 24 hours ending 05/18/21 0730 Filed Weights   05/14/2021 1257  Weight: 60 kg    Examination:  General exam: Appears comfortable. Disheveled & appears older than stated age  Respiratory system: course breath sounds b/l  Cardiovascular system: S1 & S2 +. Gastrointestinal system: Abdomen is nondistended, soft and nontender. Hypoactive  bowel sounds heard. Central nervous system: obtunded Psychiatry: Judgement and insight appear  poor    Data Reviewed: I have personally reviewed following labs and imaging studies  CBC: Recent Labs  Lab 05/08/2021 1300   WBC 11.9*  NEUTROABS 7.5  HGB 11.6*  HCT 31.6*  MCV 101.3*  PLT 135*   Basic Metabolic Panel: Recent Labs  Lab 05/15/2021 1300 05/20/2021 1528  NA 121*  --   K 4.5  --   CL 90*  --   CO2 17*  --   GLUCOSE 76  --   BUN 69*  --   CREATININE 4.97*  --   CALCIUM 7.7*  --   MG  --  2.0  PHOS  --  4.1   GFR: Estimated Creatinine Clearance: 15.8 mL/min (A) (by C-G formula based on SCr of 4.97 mg/dL (H)). Liver Function Tests: Recent Labs  Lab 05/27/2021 1300  AST 118*  ALT 41  ALKPHOS 66  BILITOT 6.6*  PROT 6.9  ALBUMIN 2.3*   Recent Labs  Lab 05/11/2021 1300  LIPASE 83*   Recent Labs  Lab 05/26/2021 1300  AMMONIA 85*   Coagulation Profile: Recent Labs  Lab 05/04/2021 1340  INR 1.8*   Cardiac Enzymes: Recent Labs  Lab 05/17/2021 1300  CKTOTAL 45*   BNP (last 3 results) No results for input(s): PROBNP in the last 8760 hours. HbA1C: No results for input(s): HGBA1C in the last 72 hours. CBG: No results for input(s): GLUCAP in the last 168 hours. Lipid Profile: No results for input(s): CHOL, HDL, LDLCALC, TRIG, CHOLHDL, LDLDIRECT in the last 72 hours. Thyroid Function Tests: No results for input(s): TSH, T4TOTAL, FREET4, T3FREE, THYROIDAB in the  last 72 hours. Anemia Panel: No results for input(s): VITAMINB12, FOLATE, FERRITIN, TIBC, IRON, RETICCTPCT in the last 72 hours. Sepsis Labs: Recent Labs  Lab 2021-05-25 1300 2021/05/25 1308 25-May-2021 1801  LATICACIDVEN 3.4* 2.6* 6.1*    Recent Results (from the past 240 hour(s))  Culture, blood (routine x 2)     Status: None (Preliminary result)   Collection Time: 05-25-2021  1:00 PM   Specimen: BLOOD  Result Value Ref Range Status   Specimen Description BLOOD RIGHT ANTECUBITAL  Final   Special Requests   Final    BOTTLES DRAWN AEROBIC AND ANAEROBIC Blood Culture adequate volume   Culture   Final    NO GROWTH 2 DAYS Performed at [facility], [address] [place], [place] [address]    Report Status  PENDING  Incomplete  Resp Panel by RT-PCR (Flu A&B, Covid) Nasopharyngeal Swab     Status: None   Collection Time: May 25, 2021  1:00 PM   Specimen: Nasopharyngeal Swab; Nasopharyngeal(NP) swabs in vial transport medium  Result Value Ref Range Status   SARS Coronavirus 2 by RT PCR NEGATIVE NEGATIVE Final    Comment: (NOTE) SARS-CoV-2 target nucleic acids are NOT DETECTED.  The SARS-CoV-2 RNA is generally detectable in upper respiratory specimens during the acute phase of infection. The lowest concentration of SARS-CoV-2 viral copies this assay can detect is 138 copies/mL. A negative result does not preclude SARS-Cov-2 infection and should not be used as the sole basis for treatment or other patient management decisions. A negative result may occur with  improper specimen collection/handling, submission of specimen other than nasopharyngeal swab, presence of viral mutation(s) within the areas targeted by this assay, and inadequate number of viral copies(<138 copies/mL). A negative result must be combined with clinical observations, patient history, and epidemiological information. The expected result is Negative.  Fact Sheet for Patients:  BloggerCourse.com  Fact Sheet for Healthcare Providers:  SeriousBroker.it  This test is no t yet approved or cleared by the [place] FDA and  has been authorized for detection and/or diagnosis of SARS-CoV-2 by FDA under an Emergency Use Authorization (EUA). This EUA will remain  in effect (meaning this test can be used) for the duration of the COVID-19 declaration under Section 564(b)(1) of the Act, 21 U.S.C.section 360bbb-3(b)(1), unless the authorization is terminated  or revoked sooner.       Influenza A by PCR NEGATIVE NEGATIVE Final   Influenza B by PCR NEGATIVE NEGATIVE Final    Comment: (NOTE) The Xpert Xpress SARS-CoV-2/FLU/RSV plus assay is intended as an aid in the diagnosis of  influenza from Nasopharyngeal swab specimens and should not be used as a sole basis for treatment. Nasal washings and aspirates are unacceptable for Xpert Xpress SARS-CoV-2/FLU/RSV testing.  Fact Sheet for Patients: BloggerCourse.com  Fact Sheet for Healthcare Providers: SeriousBroker.it  This test is not yet approved or cleared by the [place] FDA and has been authorized for detection and/or diagnosis of SARS-CoV-2 by FDA under an Emergency Use Authorization (EUA). This EUA will remain in effect (meaning this test can be used) for the duration of the COVID-19 declaration under Section 564(b)(1) of the Act, 21 U.S.C. section 360bbb-3(b)(1), unless the authorization is terminated or revoked.  Performed at [facility], [address] [place], [place] [address]   Culture, blood (routine x 2)     Status: None (Preliminary result)   Collection Time: 2021/05/25  1:01 PM   Specimen: BLOOD  Result Value Ref Range Status  Specimen Description BLOOD LEFT ANTECUBITAL  Final   Special Requests   Final    BOTTLES DRAWN AEROBIC AND ANAEROBIC Blood Culture adequate volume   Culture   Final    NO GROWTH 2 DAYS Performed at [facility], [address], [place], [place] [address]    Report Status PENDING  Incomplete         Radiology Studies: CT ABDOMEN PELVIS WO CONTRAST  Result Date: 10-Jun-2021 CLINICAL DATA:  Abdominal pain and distension EXAM: CT ABDOMEN AND PELVIS WITHOUT CONTRAST TECHNIQUE: Multidetector CT imaging of the abdomen and pelvis was performed following the standard protocol without IV contrast. COMPARISON:  None. FINDINGS: Lower chest: Mild dependent bibasilar tree-in-bud and nodular opacities may represent early mild basilar pneumonia. Dependent right base atelectasis. trace right effusion. normal heart size. No pericardial effusion. Small hiatal hernia noted. Hepatobiliary: Limited without IV  contrast. Small liver with surface nodularity and very heterogeneous appearance. Liver has a mixed ill-defined areas of diffuse masslike hypoattenuation. difficult to exclude underlying multifocal hepatic abnormality or masses. No biliary obstruction pattern. Gallbladder is obscured by adjacent ascites. Common bile duct nondilated. Pancreas: Unremarkable. No pancreatic ductal dilatation or surrounding inflammatory changes. Spleen: Normal in size without focal abnormality. Adrenals/Urinary Tract: Adrenal glands are unremarkable. Kidneys are normal, without renal calculi, focal lesion, or hydronephrosis. Bladder is unremarkable. Stomach/Bowel: Limited without IV and oral contrast. Some degree of bowel wall thickening of small bowel and the colon, suspected component of mild enterocolitis. No obstruction pattern, significant dilatation, or ileus. Negative for free air. Vascular/Lymphatic: Limited without IV contrast. Negative for aneurysm. Aorta atherosclerotic changes noted. No retroperitoneal hemorrhage or hematoma. No bulky adenopathy. Reproductive: No significant finding by CT. Other: Intact abdominal wall.  No ventral hernia. Large volume of abdominopelvic ascites. Musculoskeletal: No acute osseous finding. L5-S1 degenerative changes. No compression fracture. IMPRESSION: Limited exam without IV and oral contrast. Very abnormal appearance of the liver by noncontrast imaging. Difficult to exclude underlying cirrhosis and multifocal hepatic process or lesions. Large volume of abdominopelvic ascites. Suspect some degree of small and large bowel scattered wall thickening suggesting enterocolitis. No associated obstruction pattern or ileus. Negative for free air. Aortic Atherosclerosis (ICD10-I70.0). Electronically Signed   By: Judie Petit.  Shick M.D.   On: 06/10/21 15:10   DG Chest Portable 1 View  Result Date: 06-10-21 CLINICAL DATA:  SOB, weakness, and wheezing. EXAM: PORTABLE CHEST - 1 VIEW COMPARISON:  none  FINDINGS: Relatively low lung volumes. Linear scarring or atelectasis at the left base and in the right suprahilar region. Lungs otherwise clear. Heart size and mediastinal contours are within normal limits. No effusion. Visualized bones unremarkable. IMPRESSION: Low volumes.  No acute disease. Electronically Signed   By: Corlis Leak M.D.   On: 2021-06-10 14:06        Scheduled Meds:  mouth rinse  15 mL Mouth Rinse BID   sodium chloride flush  3 mL Intravenous Q12H   Continuous Infusions:  sodium chloride     morphine 5 mg/hr (05/18/21 0101)     LOS: 2 days    Time spent: 10 mins     Charise Killian, MD [facility] Pager [phone]  If 7PM-7AM, please contact night-coverage 05/18/2021, 7:30 AM

## 2021-05-18 NOTE — Progress Notes (Signed)
Daily Progress Note   Patient Name: Robert Atkinson       Date: 05/18/2021 DOB: 12-Oct-1975  Age: 45 y.o. MRN#: [id number] Attending Physician: Charise Killian, MD Primary Care Physician: Patient, [name] Admit Date: 05/15/2021  Reason for Consultation/Follow-up: Terminal Care  Subjective: Patient is currently on comfort care. Respirations are unlabored and at times during my visit, irregular. He appears comfortable. Mother and family is at bedside. Sister is going to pick up a friend of the patient's who wants to visit. Patient is in dying process.   Length of Stay: 2  Current Medications: Scheduled Meds:   mouth rinse  15 mL Mouth Rinse BID   sodium chloride flush  3 mL Intravenous Q12H    Continuous Infusions:  sodium chloride     morphine 5 mg/hr (05/18/21 0101)    PRN Meds: sodium chloride, acetaminophen, antiseptic oral rinse, diphenhydrAMINE, docusate sodium, glycopyrrolate **OR** glycopyrrolate **OR** glycopyrrolate, haloperidol **OR** haloperidol **OR** haloperidol lactate, ipratropium-albuterol, LORazepam **OR** LORazepam **OR** LORazepam, LORazepam, morphine injection, ondansetron **OR** ondansetron (ZOFRAN) IV, sodium chloride flush  Physical Exam Constitutional:      Comments: Eyes half closed. Unresponsive. Times of irregular breathing.             Vital Signs: BP (!) 85/57 (BP Location: Left Arm)    Pulse 84    Temp (!) 97.4 F (36.3 C) (Rectal)    Resp 11    Ht 6\' 1"  (1.854 m)    Wt 60 kg    SpO2 97%    BMI 17.45 kg/m  SpO2: SpO2: 97 % O2 Device: O2 Device: Nasal Cannula O2 Flow Rate: O2 Flow Rate (L/min): 3 L/min  Intake/output summary:  Intake/Output Summary (Last 24 hours) at 05/18/2021 1255 Last data filed at 05/18/2021 1000 Gross per  24 hour  Intake 122.74 ml  Output 200 ml  Net -77.26 ml   LBM: Last BM Date:  (last BM unknown) Baseline Weight: Weight: 60 kg Most recent weight: Weight: 60 kg        Patient Active Problem List   Diagnosis Date Noted   Liver failure (HCC) 05/24/2021    Palliative Care Assessment & Plan   Recommendations/Plan: No changes to comfort medications recommended.  Prognosis is hours to potentially days.  He is not stable to transport  from hospital.   Code Status:    Code Status Orders  (From admission, onward)           Start     Ordered   05/21/2021 2035  Do not attempt resuscitation (DNR)  Continuous       Question Answer Comment  In the event of cardiac or respiratory ARREST Do not call a code blue   In the event of cardiac or respiratory ARREST Do not perform Intubation, CPR, defibrillation or ACLS   In the event of cardiac or respiratory ARREST Use medication by any route, position, wound care, and other measures to relive pain and suffering. May use oxygen, suction and manual treatment of airway obstruction as needed for comfort.      05/21/21 2037           Code Status History     Date Active Date Inactive Code Status Order ID Comments User Context   05/21/21 1952 May 21, 2021 2037 DNR [id number]  Erin Fulling, MD ED   05/21/21 1723 May 21, 2021 1952 Full Code [id number]  Erin Fulling, MD ED       Thank you for allowing the Palliative Medicine Team to assist in the care of this patient.       Total Time 15 min Prolonged Time Billed  no       Greater than 50%  of this time was spent counseling and coordinating care related to the above assessment and plan.  Morton Stall, NP  Please contact Palliative Medicine Team phone at [phone] for questions and concerns.

## 2021-05-19 DIAGNOSIS — N19 Unspecified kidney failure: Secondary | ICD-10-CM

## 2021-05-19 DIAGNOSIS — F10129 Alcohol abuse with intoxication, unspecified: Secondary | ICD-10-CM

## 2021-05-21 LAB — CULTURE, BLOOD (ROUTINE X 2)
Culture: NO GROWTH
Culture: NO GROWTH
Special Requests: ADEQUATE
Special Requests: ADEQUATE

## 2021-06-04 NOTE — Plan of Care (Signed)
PMT note:  In to see patient for symptom management check. Patient has expired and is lying peacefully in the bed. No family present in the room.   No charge note.

## 2021-06-04 NOTE — Death Summary Note (Signed)
Death Summary  Robert Atkinson [id number]:[id number] DOB: 02/06/1976 DOA: 2021-05-24  PCP: Patient, [name]   Admit date: 24-May-2021 Date of Death: 05-27-21  Final Diagnoses:  Principal Problem:   Liver failure (HCC)      History of present illness:  HPI was taken from Dr. Belia Atkinson: 46 y.o. male with only known PMH as below who presents by EMS from a hotel room with shortness of breath and diarrhea over the last several days.     Per EMS, the patient was staying at a motel and was to be evicted today when the hotel staff found him.     The patient reports diarrhea for the last several days and has been but currently primary complaints of shortness of breath.  He also has some abdominal pain and distention.   Diagnosis is acute renal failure acute liver failure and respiratory failure with septic shock   Patient is at very high risk for cardiac arrest and death Patient moaning and  groaning in pain Able to communicate but lethargic Unable to straighten out legs Spider angiomas all over his body     CT ABD Very abnormal appearance of the liver by noncontrast imaging. Difficult to exclude underlying cirrhosis and multifocal hepatic process or lesions.   Large volume of abdominopelvic ascites.   Suspect some degree of small and large bowel scattered wall thickening suggesting enterocolitis. No associated obstruction pattern or ileus. Negative for free air.   ER COURSE GIVEN 4L IVF's Given NEB therapy    As per Dr. Belia Atkinson: The Clinical status was relayed to family in detail. All family at bedside Updated and notified of patients medical condition.       Patient remains unresponsive and will not open eyes to command.   Patient is having a weak cough and struggling to remove secretions.   Patient with increased WOB and using accessory muscles to breathe Explained to family course of therapy and the modalities      Patient with Progressive multiorgan failure  with a very high probablity of a very minimal chance of meaningful recovery despite all aggressive and optimal medical therapy.    As per Dr. Mayford Atkinson 12/15-10-21-22: Pt was already on comfort care only by the time I started taking care of the pt. Unfortunately, the pt passed away on 05-27-21 at 9:43am.   Hospital Course:  Acute hypoxic & hypercapnic respiratory failure: continue w/ comfort care only   COPD exacerbation: severe. Continue w/ comfort care only   Alcohol abuse: severe. Continue w/ comfort care only    Acute liver failure: likely secondary to severe alcohol abuse. Continue w/ comfort care only   AKI: continue w/ comfort care only    Acute toxic metabolic encephalopathy: likely secondary to alcohol abuse. Continue w/ comfort care only   Enterocolitis: continue w/ comfort care only    Time: < 30 mins was spent on this encounter  Time of death: 9:43AM  Signed:  BRENTLEE Atkinson  [facility] May 27, 2021, 10:28 AM

## 2021-06-04 DEATH — deceased
